# Patient Record
Sex: Female | Born: 1995 | Race: White | Hispanic: No | Marital: Married | Smoking: Never smoker
Health system: Southern US, Community
[De-identification: ages and names within clinical notes are randomized; demographics above are authoritative.]

## PROBLEM LIST (undated history)

## (undated) DIAGNOSIS — Z789 Other specified health status: Secondary | ICD-10-CM

## (undated) HISTORY — PX: TONSILLECTOMY: SUR1361

## (undated) HISTORY — DX: Other specified health status: Z78.9

---
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
## 2013-01-14 ENCOUNTER — Ambulatory Visit: Payer: 59

## 2013-08-10 ENCOUNTER — Encounter: Payer: Self-pay | Admitting: *Deleted

## 2013-11-30 ENCOUNTER — Observation Stay: Payer: Self-pay

## 2014-01-08 ENCOUNTER — Inpatient Hospital Stay: Payer: Self-pay | Admitting: Obstetrics and Gynecology

## 2014-01-08 LAB — CBC WITH DIFFERENTIAL/PLATELET
BASOS ABS: 0 10*3/uL (ref 0.0–0.1)
Basophil %: 0.1 %
EOS PCT: 0.3 %
Eosinophil #: 0 10*3/uL (ref 0.0–0.7)
HCT: 40.5 % (ref 35.0–47.0)
HGB: 13.1 g/dL (ref 12.0–16.0)
LYMPHS ABS: 1.4 10*3/uL (ref 1.0–3.6)
Lymphocyte %: 10.6 %
MCH: 30.3 pg (ref 26.0–34.0)
MCHC: 32.3 g/dL (ref 32.0–36.0)
MCV: 94 fL (ref 80–100)
MONO ABS: 0.7 x10 3/mm (ref 0.2–0.9)
Monocyte %: 5.2 %
NEUTROS ABS: 10.8 10*3/uL — AB (ref 1.4–6.5)
NEUTROS PCT: 83.8 %
Platelet: 201 10*3/uL (ref 150–440)
RBC: 4.33 10*6/uL (ref 3.80–5.20)
RDW: 12.5 % (ref 11.5–14.5)
WBC: 12.9 10*3/uL — AB (ref 3.6–11.0)

## 2014-01-08 LAB — GC/CHLAMYDIA PROBE AMP

## 2014-01-10 LAB — HEMATOCRIT: HCT: 33.1 % — ABNORMAL LOW (ref 35.0–47.0)

## 2014-10-05 NOTE — H&P (Signed)
L&D Evaluation:  History:  HPI 19 year old g1 P0 with EDC 01/03/2014 by a 6wk4day ultrasound presents at 35 1/7 weeks with decreased FM this AM. [facility] uncomplicated. Denies VB or LOF.   Presents with decreased fetal movement   Patient's Medical History No Chronic Illness   Patient's Surgical History none   Medications Pre Natal Vitamins   Allergies NKDA   Social History none   Family History Non-Contributory   ROS:  ROS see HPI   Exam:  Vital Signs stable  106/67   Urine Protein not completed   General no apparent distress   Mental Status clear   Abdomen gravid, non-tender   Mebranes Intact   FHT normal rate with no decels, 130 baseline with accels to 150-180s   Ucx occ, mild   Skin dry   Impression:  Impression IUP at 35 1/7 weeks with reactive NST   Plan:  [facility]  [facility]   Electronic Signatures: Trinna BalloonGutierrez, Zyann Mabry L (CNM)  (Signed 06-Jul-15 17:16)  Authored: L&D Evaluation   Last Updated: 06-Jul-15 17:16 by Trinna BalloonGutierrez, Liliya Fullenwider L (CNM)

## 2014-10-14 ENCOUNTER — Encounter: Payer: Self-pay | Admitting: Family Medicine

## 2014-10-14 ENCOUNTER — Ambulatory Visit (INDEPENDENT_AMBULATORY_CARE_PROVIDER_SITE_OTHER): Payer: 59 | Admitting: Family Medicine

## 2014-10-14 VITALS — BP 104/80 | Temp 97.9°F | Ht 65.0 in | Wt 136.5 lb

## 2014-10-14 DIAGNOSIS — B9689 Other specified bacterial agents as the cause of diseases classified elsewhere: Secondary | ICD-10-CM

## 2014-10-14 DIAGNOSIS — J019 Acute sinusitis, unspecified: Secondary | ICD-10-CM | POA: Diagnosis not present

## 2014-10-14 MED ORDER — AMOXICILLIN 500 MG PO TABS: 500.0000 mg | ORAL_TABLET | Freq: Three times a day (TID) | ORAL | Status: DC

## 2014-10-14 NOTE — Progress Notes (Signed)
   Subjective:    Patient ID: Lynn Rice, female    DOB: 10-Dec-1995, 19 y.o.   MRN: [id number]  Sinusitis This is a new problem. The current episode started 1 to 4 weeks ago. The problem is unchanged. There has been no fever. Associated symptoms include congestion, coughing, ear pain and sinus pressure. Pertinent negatives include no shortness of breath. Treatments tried: OTC Cough/Sinus cold medicine. The treatment provided no relief.   Patient has no other concerns this visit.   Review of Systems  Constitutional: Negative for fever and activity change.  HENT: Positive for congestion, ear pain, rhinorrhea and sinus pressure.   Eyes: Negative for discharge.  Respiratory: Positive for cough. Negative for shortness of breath and wheezing.   Cardiovascular: Negative for chest pain.       Objective:   Physical Exam  Constitutional: She appears well-developed.  HENT:  Head: Normocephalic.  Nose: Nose normal.  Mouth/Throat: Oropharynx is clear and moist. No oropharyngeal exudate.  Neck: Neck supple.  Cardiovascular: Normal rate and normal heart sounds.   No murmur heard. Pulmonary/Chest: Effort normal and breath sounds normal. She has no wheezes.  Lymphadenopathy:    She has no cervical adenopathy.  Skin: Skin is warm and dry.  Nursing note and vitals reviewed.         Assessment & Plan:  Viral illness probably picked up from her child Secondary sinusitis Warning signs discussed Follow-up if ongoing troubles

## 2015-09-01 DIAGNOSIS — Z3040 Encounter for surveillance of contraceptives, unspecified: Secondary | ICD-10-CM | POA: Diagnosis not present

## 2015-09-01 DIAGNOSIS — Z01419 Encounter for gynecological examination (general) (routine) without abnormal findings: Secondary | ICD-10-CM | POA: Diagnosis not present

## 2015-09-01 DIAGNOSIS — Z124 Encounter for screening for malignant neoplasm of cervix: Secondary | ICD-10-CM | POA: Diagnosis not present

## 2016-08-14 ENCOUNTER — Telehealth: Payer: Self-pay | Admitting: Obstetrics and Gynecology

## 2016-08-14 NOTE — Telephone Encounter (Signed)
Pt contacted office for refill request on the following medications: Junel FE 1/20 1 mg-320mcg. [facility]. Pt is schedule 09/10/16 with [facility]. CB#[phone]

## 2016-08-14 NOTE — Telephone Encounter (Signed)
Please refill.

## 2016-08-15 MED ORDER — NORETHINDRONE ACET-ETHINYL EST 1-20 MG-MCG PO TABS: 1.0000 | ORAL_TABLET | Freq: Every day | ORAL | 2 refills | Status: DC

## 2016-08-15 NOTE — Telephone Encounter (Signed)
Has been sent.

## 2016-08-17 ENCOUNTER — Telehealth: Payer: Self-pay

## 2016-08-17 NOTE — Telephone Encounter (Signed)
Pt calling today spoke with HopewellSandy S. About the Birth Control refill that was sent in. States it was the wrong kind and would like a call back .

## 2016-08-17 NOTE — Telephone Encounter (Signed)
Questions answered regarding the birthcontrol that was called in. KJ CMA

## 2016-09-10 ENCOUNTER — Encounter: Payer: Self-pay | Admitting: Obstetrics and Gynecology

## 2016-09-11 ENCOUNTER — Ambulatory Visit (INDEPENDENT_AMBULATORY_CARE_PROVIDER_SITE_OTHER): Payer: 59 | Admitting: Obstetrics and Gynecology

## 2016-09-11 ENCOUNTER — Encounter: Payer: Self-pay | Admitting: Obstetrics and Gynecology

## 2016-09-11 DIAGNOSIS — Z124 Encounter for screening for malignant neoplasm of cervix: Secondary | ICD-10-CM

## 2016-09-11 DIAGNOSIS — Z01419 Encounter for gynecological examination (general) (routine) without abnormal findings: Secondary | ICD-10-CM

## 2016-09-11 DIAGNOSIS — Z113 Encounter for screening for infections with a predominantly sexual mode of transmission: Secondary | ICD-10-CM

## 2016-09-11 MED ORDER — NORETHINDRONE ACET-ETHINYL EST 1-20 MG-MCG PO TABS: 1.0000 | ORAL_TABLET | Freq: Every day | ORAL | 3 refills | Status: DC

## 2016-09-11 NOTE — Patient Instructions (Signed)
Preventive Care 18-39 Years, Female Preventive care refers to lifestyle choices and visits with your health care provider that can promote health and wellness. What does preventive care include?  A yearly physical exam. This is also called an annual well check.  Dental exams once or twice a year.  Routine eye exams. Ask your health care provider how often you should have your eyes checked.  Personal lifestyle choices, including:  Daily care of your teeth and gums.  Regular physical activity.  Eating a healthy diet.  Avoiding tobacco and drug use.  Limiting alcohol use.  Practicing safe sex.  Taking vitamin and mineral supplements as recommended by your health care provider. What happens during an annual well check? The services and screenings done by your health care provider during your annual well check will depend on your age, overall health, lifestyle risk factors, and family history of disease. Counseling  Your health care provider may ask you questions about your:  Alcohol use.  Tobacco use.  Drug use.  Emotional well-being.  Home and relationship well-being.  Sexual activity.  Eating habits.  Work and work environment.  Method of birth control.  Menstrual cycle.  Pregnancy history. Screening  You may have the following tests or measurements:  Height, weight, and BMI.  Diabetes screening. This is done by checking your blood sugar (glucose) after you have not eaten for a while (fasting).  Blood pressure.  Lipid and cholesterol levels. These may be checked every 5 years starting at age 20.  Skin check.  Hepatitis C blood test.  Hepatitis B blood test.  Sexually transmitted disease (STD) testing.  BRCA-related cancer screening. This may be done if you have a family history of breast, ovarian, tubal, or peritoneal cancers.  Pelvic exam and Pap test. This may be done every 3 years starting at age 21. Starting at age 30, this may be done every 5  years if you have a Pap test in combination with an HPV test. Discuss your test results, treatment options, and if necessary, the need for more tests with your health care provider. Vaccines  Your health care provider may recommend certain vaccines, such as:  Influenza vaccine. This is recommended every year.  Tetanus, diphtheria, and acellular pertussis (Tdap, Td) vaccine. You may need a Td booster every 10 years.  Varicella vaccine. You may need this if you have not been vaccinated.  HPV vaccine. If you are 26 or younger, you may need three doses over 6 months.  Measles, mumps, and rubella (MMR) vaccine. You may need at least one dose of MMR. You may also need a second dose.  Pneumococcal 13-valent conjugate (PCV13) vaccine. You may need this if you have certain conditions and were not previously vaccinated.  Pneumococcal polysaccharide (PPSV23) vaccine. You may need one or two doses if you smoke cigarettes or if you have certain conditions.  Meningococcal vaccine. One dose is recommended if you are age 19-21 years and a first-year college student living in a residence hall, or if you have one of several medical conditions. You may also need additional booster doses.  Hepatitis A vaccine. You may need this if you have certain conditions or if you travel or work in places where you may be exposed to hepatitis A.  Hepatitis B vaccine. You may need this if you have certain conditions or if you travel or work in places where you may be exposed to hepatitis B.  Haemophilus influenzae type b (Hib) vaccine. You may need this   if you have certain risk factors. Talk to your health care provider about which screenings and vaccines you need and how often you need them. This information is not intended to replace advice given to you by your health care provider. Make sure you discuss any questions you have with your health care provider. Document Released: 07/10/2001 Document Revised: 02/01/2016  Document Reviewed: 03/15/2015 Elsevier Interactive Patient Education  2017 [employer].

## 2016-09-11 NOTE — Progress Notes (Addendum)
Patient ID: Lynn Rice, female   DOB: [birth year], 21 y.o.   MRN: [id number]     Gynecology Annual Exam  PCP: Lilyan Punt, MD  Chief Complaint:  Chief Complaint  Patient presents with  . Gynecologic Exam    History of Present Illness: Patient is a 21 y.o. G1P1001 presents for annual exam. The patient has no complaints today.   LMP: Patient's last menstrual period was 09/03/2016 (exact date). Average Interval: regular, 28 days Duration of flow: 5 days Heavy Menses: no Clots: no Intermenstrual Bleeding: no Postcoital Bleeding: no Dysmenorrhea: no  The patient is sexually active. She currently uses Oral contraceptives: Present: yes for contraception. She denies dyspareunia.  The patient does not perform self breast exams.  There is no notable family history of breast or ovarian cancer in her family.  The patient wears seatbelts: yes.  The patient has regular exercise: not asked.    The patient denies current symptoms of depression.    Review of Systems: Review of Systems  Constitutional: Negative for chills and fever.  HENT: Negative for congestion.   Respiratory: Negative for cough and shortness of breath.   Cardiovascular: Negative for chest pain and palpitations.  Gastrointestinal: Negative for abdominal pain, constipation, diarrhea, heartburn, nausea and vomiting.  Genitourinary: Negative for dysuria, frequency and urgency.  Skin: Negative for itching and rash.  Neurological: Negative for dizziness and headaches.  Endo/Heme/Allergies: Negative for polydipsia.  Psychiatric/Behavioral: Negative for depression.    Past Medical History:  No past medical history on file.  Past Surgical History:  Past Surgical History:  Procedure Laterality Date  . TONSILLECTOMY      Gynecologic History:  Patient's last menstrual period was 09/03/2016 (exact date). Contraception: OCP (estrogen/progesterone) Last Pap: Results were: no abnormalities   Obstetric History:  G1P1001  Family History:  Family History  Problem Relation Age of Onset  . Kidney disease Paternal Grandfather   . Heart failure Paternal Grandfather   . Cervical cancer Maternal Grandmother     Social History:  Social History   Social History  . Marital status: Single    Spouse name: N/A  . Number of children: N/A  . Years of education: N/A   Occupational History  . Not on file.   Social History Main Topics  . Smoking status: Never Smoker  . Smokeless tobacco: Never Used  . Alcohol use Yes  . Drug use: No  . Sexual activity: Yes    Partners: Male    Birth control/ protection: Pill   Other Topics Concern  . Not on file   Social History Narrative  . No narrative on file    Allergies:  No Known Allergies  Medications: Prior to Admission medications   Medication Sig Start Date End Date Taking? Authorizing Provider  norethindrone-ethinyl estradiol Elmarie Shiley 1/20) 1-20 MG-MCG tablet Take 1 tablet by mouth daily. 08/15/16   Vena Austria, MD    Physical Exam Vitals: Blood pressure 102/70, height  (1.651 m), weight 155 lb (70.3 kg), last menstrual period 09/03/2016.  General: NAD HEENT: normocephalic, anicteric Thyroid: no enlargement, no palpable nodules Pulmonary: No increased work of breathing, CTAB Cardiovascular: RRR, distal pulses 2+ Breast: Breast symmetrical, no tenderness, no palpable nodules or masses, no skin or nipple retraction present, no nipple discharge.  No axillary or supraclavicular lymphadenopathy. Abdomen: NABS, soft, non-tender, non-distended.  Umbilicus without lesions.  No hepatomegaly, splenomegaly or masses palpable. No evidence of hernia  Genitourinary:  External: Normal external female genitalia.  Normal urethral meatus,  normal  Bartholin's and Skene's glands.    Vagina: Normal vaginal mucosa, no evidence of prolapse.    Cervix: Grossly normal in appearance, no bleeding  Uterus: Non-enlarged, mobile, normal contour.  No  CMT  Adnexa: ovaries non-enlarged, no adnexal masses  Rectal: deferred  Lymphatic: no evidence of inguinal lymphadenopathy Extremities: no edema, erythema, or tenderness Neurologic: Grossly intact Psychiatric: mood appropriate, affect full  Female chaperone present for pelvic and breast  portions of the physical exam    Assessment: 21 y.o. G1P1001 routine annual exam  Plan: Problem List Items Addressed This Visit    None    Visit Diagnoses    Screening for malignant neoplasm of cervix       Relevant Orders   Pap Lb, Ct-Ng, rfx HPV ASCU   Encounter for gynecological examination without abnormal finding       Relevant Orders   Pap Lb, Ct-Ng, rfx HPV ASCU   Screen for STD (sexually transmitted disease)       Relevant Orders   Pap Lb, Ct-Ng, rfx HPV ASCU      1) 4) Gardasil Series discussed and if applicable offered to patient - Patient has previously completed 3 shot series   2) STI screening was offered and declined - GC/CT with pap as per USPTF and CDC recommendations  3) ASCCP guidelines and rational discussed.  Patient opts for every 3 years screening interval  4) Contraception - Education given regarding options for contraception, continue current OCP. We discussed safe sex practices to reduce her furture risk of STI's.    5) Follow up 1 year for routine annual exam

## 2016-09-14 LAB — PAP LB, CT-NG, RFX HPV ASCU
Chlamydia, Nuc. Acid Amp: NEGATIVE
GONOCOCCUS, NUC. ACID AMP: NEGATIVE
PAP Smear Comment: 0

## 2016-09-17 ENCOUNTER — Encounter: Payer: Self-pay | Admitting: Obstetrics and Gynecology

## 2016-12-19 ENCOUNTER — Ambulatory Visit: Payer: 59 | Admitting: Advanced Practice Midwife

## 2016-12-24 ENCOUNTER — Ambulatory Visit: Payer: 59 | Admitting: Obstetrics and Gynecology

## 2016-12-24 ENCOUNTER — Ambulatory Visit (INDEPENDENT_AMBULATORY_CARE_PROVIDER_SITE_OTHER): Payer: 59 | Admitting: Obstetrics and Gynecology

## 2016-12-24 ENCOUNTER — Encounter: Payer: Self-pay | Admitting: Obstetrics and Gynecology

## 2016-12-24 VITALS — BP 104/54 | HR 82 | Wt 150.0 lb

## 2016-12-24 DIAGNOSIS — Z3A08 8 weeks gestation of pregnancy: Secondary | ICD-10-CM

## 2016-12-24 DIAGNOSIS — N912 Amenorrhea, unspecified: Secondary | ICD-10-CM | POA: Diagnosis not present

## 2016-12-24 DIAGNOSIS — Z3490 Encounter for supervision of normal pregnancy, unspecified, unspecified trimester: Secondary | ICD-10-CM

## 2016-12-24 DIAGNOSIS — Z3480 Encounter for supervision of other normal pregnancy, unspecified trimester: Secondary | ICD-10-CM

## 2016-12-24 DIAGNOSIS — Z3689 Encounter for other specified antenatal screening: Secondary | ICD-10-CM

## 2016-12-24 LAB — POCT URINE PREGNANCY: Preg Test, Ur: POSITIVE — AB

## 2016-12-24 NOTE — Progress Notes (Signed)
Patient ID: Lynn Rice, female   DOB: 27-Jul-1995, 21 y.o.   MRN: [id number]       New Obstetric Patient H&P    Chief Complaint: "Desires prenatal care"   History of Present Illness: Patient is a 21 y.o. G2P1001 Not Hispanic or Latino female, Patient's last menstrual period was 10/29/2016. presents with amenorrhea and positive home pregnancy test. Based on her  LMP, her EDD is Estimated Date of Delivery: 08/05/17 and her EGA is [id number].  She had a urine pregnancy test which was positive 3 week(s)  ago. Her last menstrual period was normal and lasted for  4 day(s). Since her LMP she claims she has experienced some mild nausea, breast tenderness, and fatigue. She denies vaginal bleeding. Her past medical history is noncontributory. Her prior pregnancies are notable for none  Since her LMP, she admits to the use of tobacco products  no She claims she has gained   no pounds since the start of her pregnancy.  There are cats in the home in the home  no If yes N/A She admits close contact with children on a regular basis  yes  She has had chicken pox in the past unknown She has had Tuberculosis exposures, symptoms, or previously tested positive for TB   no Current or past history of domestic violence. no  Genetic Screening/Teratology Counseling: (Includes patient, baby's father, or anyone in either family with:)   1. [facility]/= [facility]  no 2. Thalassemia ([place], [place], Mediterranean, or Asian background): MCV<80  no 3. Neural tube defect (meningomyelocele, spina bifida, anencephaly)  no 4. Congenital heart defect  no  5. Down syndrome  no 6. Tay-Sachs (Jewish, [place])  no 7. Canavan's Disease  no 8. Sickle cell disease or trait (African)  no  9. Hemophilia or other blood disorders  no  10. Muscular dystrophy  no  11. Cystic fibrosis  no  12. Huntington's Chorea  no  13. Mental retardation/autism  no 14. Other inherited genetic or chromosomal disorder  no 15.  Maternal metabolic disorder (DM, PKU, etc)  no 16. Patient or FOB with a child with a birth defect not listed above no  16a. Patient or FOB with a birth defect themselves no 17. Recurrent pregnancy loss, or stillbirth  no  18. Any medications since LMP other than prenatal vitamins (include vitamins, supplements, OTC meds, drugs, alcohol)  no 19. Any other genetic/environmental exposure to discuss  no  Infection History:   1. Lives with someone with TB or TB exposed  no  2. Patient or partner has history of genital herpes  no 3. Rash or viral illness since LMP  no 4. History of STI (GC, CT, HPV, syphilis, HIV)  no 5. History of recent travel :  no  Other pertinent information:  no     Review of Systems:10 point review of systems negative unless otherwise noted in HPI  Past Medical History:  No past medical history on file.  Past Surgical History:  Past Surgical History:  Procedure Laterality Date  . TONSILLECTOMY      Gynecologic History: Patient's last menstrual period was 10/29/2016.  Obstetric History: G2P1001  Family History:  Family History  Problem Relation Age of Onset  . Kidney disease Paternal Grandfather   . Heart failure Paternal Grandfather   . Cervical cancer Maternal Grandmother     Social History:  Social History   Social History  . Marital status: Single    Spouse name:  N/A  . Number of children: N/A  . Years of education: N/A   Occupational History  . Not on file.   Social History Main Topics  . Smoking status: Never Smoker  . Smokeless tobacco: Never Used  . Alcohol use Yes  . Drug use: No  . Sexual activity: Yes    Partners: Male    Birth control/ protection: Pill   Other Topics Concern  . Not on file   Social History Narrative  . No narrative on file    Allergies:  No Known Allergies  Medications: Prior to Admission medications   Not on File    Physical Exam Vitals: Blood pressure (!) 104/54, pulse 82, weight 150 lb (68  kg), last menstrual period 10/29/2016.  General: NAD HEENT: normocephalic, anicteric Pulmonary: No increased work of breathing, CTAB Cardiovascular: RRR, distal pulses 2+ Abdomen: NABS, soft, non-tender, non-distended.  Umbilicus without lesions.  No hepatomegaly, splenomegaly or masses palpable. No evidence of hernia  Genitourinary:  External: Normal external female genitalia.  Normal urethral meatus, normal  Bartholin's and Skene's glands.    Vagina: Normal vaginal mucosa, no evidence of prolapse.    Cervix: Grossly normal in appearance, no bleeding  Uterus: Non-enlarged, mobile, normal contour.  No CMT  Adnexa: ovaries non-enlarged, no adnexal masses  Rectal: deferred Extremities: no edema, erythema, or tenderness Neurologic: Grossly intact Psychiatric: mood appropriate, affect full   Assessment: 21 y.o. G2P1001 at [redacted] weeks gestation presenting to initiate prenatal care  Plan: 1) Avoid alcoholic beverages. 2) Patient encouraged not to smoke.  3) Discontinue the use of all non-medicinal drugs and chemicals.  4) Take prenatal vitamins daily.  5) Nutrition, food safety (fish, cheese advisories, and high nitrite foods) and exercise discussed. 6) Hospital and practice style discussed with cross coverage system.  7) Genetic Screening, such as with 1st Trimester Screening, cell free fetal DNA, AFP testing, and Ultrasound, as well as with amniocentesis and CVS as appropriate, is discussed with patient. At the conclusion of today's visit patient undecided genetic testing 8) Patient is asked about travel to areas at risk for the [place] virus, and counseled to avoid travel and exposure to mosquitoes or sexual partners who may have themselves been exposed to the virus. Testing is discussed, and will be ordered as appropriate.

## 2016-12-24 NOTE — Progress Notes (Signed)
NOB 

## 2016-12-24 NOTE — Patient Instructions (Addendum)
First Trimester of Pregnancy The first trimester of pregnancy is from week 1 until the end of week 13 (months 1 through 3). A week after a sperm fertilizes an egg, the egg will implant on the wall of the uterus. This embryo will begin to develop into a baby. Genes from you and your partner will form the baby. The female genes will determine whether the baby will be a boy or a girl. At 6-8 weeks, the eyes and face will be formed, and the heartbeat can be seen on ultrasound. At the end of 12 weeks, all the baby's organs will be formed. Now that you are pregnant, you will want to do everything you can to have a healthy baby. Two of the most important things are to get good prenatal care and to follow your health care provider's instructions. Prenatal care is all the medical care you receive before the baby's birth. This care will help prevent, find, and treat any problems during the pregnancy and childbirth. Body changes during your first trimester Your body goes through many changes during pregnancy. The changes vary from woman to woman.  You may gain or lose a couple of pounds at first.  You may feel sick to your stomach (nauseous) and you may throw up (vomit). If the vomiting is uncontrollable, call your health care provider.  You may tire easily.  You may develop headaches that can be relieved by medicines. All medicines should be approved by your health care provider.  You may urinate more often. Painful urination may mean you have a bladder infection.  You may develop heartburn as a result of your pregnancy.  You may develop constipation because certain hormones are causing the muscles that push stool through your intestines to slow down.  You may develop hemorrhoids or swollen veins (varicose veins).  Your breasts may begin to grow larger and become tender. Your nipples may stick out more, and the tissue that surrounds them (areola) may become darker.  Your gums may bleed and may be  sensitive to brushing and flossing.  Dark spots or blotches (chloasma, mask of pregnancy) may develop on your face. This will likely fade after the baby is born.  Your menstrual periods will stop.  You may have a loss of appetite.  You may develop cravings for certain kinds of food.  You may have changes in your emotions from day to day, such as being excited to be pregnant or being concerned that something may go wrong with the pregnancy and baby.  You may have more vivid and strange dreams.  You may have changes in your hair. These can include thickening of your hair, rapid growth, and changes in texture. Some women also have hair loss during or after pregnancy, or hair that feels dry or thin. Your hair will most likely return to normal after your baby is born.  What to expect at prenatal visits During a routine prenatal visit:  You will be weighed to make sure you and the baby are growing normally.  Your blood pressure will be taken.  Your abdomen will be measured to track your baby's growth.  The fetal heartbeat will be listened to between weeks 10 and 14 of your pregnancy.  Test results from any previous visits will be discussed.  Your health care provider may ask you:  How you are feeling.  If you are feeling the baby move.  If you have had any abnormal symptoms, such as leaking fluid, bleeding, severe headaches,   or abdominal cramping.  If you are using any tobacco products, including cigarettes, chewing tobacco, and electronic cigarettes.  If you have any questions.  Other tests that may be performed during your first trimester include:  Blood tests to find your blood type and to check for the presence of any previous infections. The tests will also be used to check for low iron levels (anemia) and protein on red blood cells (Rh antibodies). Depending on your risk factors, or if you previously had diabetes during pregnancy, you may have tests to check for high blood  sugar that affects pregnant women (gestational diabetes).  Urine tests to check for infections, diabetes, or protein in the urine.  An ultrasound to confirm the proper growth and development of the baby.  Fetal screens for spinal cord problems (spina bifida) and Down syndrome.  HIV (human immunodeficiency virus) testing. Routine prenatal testing includes screening for HIV, unless you choose not to have this test.  You may need other tests to make sure you and the baby are doing well.  Follow these instructions at home: Medicines  Follow your health care provider's instructions regarding medicine use. Specific medicines may be either safe or unsafe to take during pregnancy.  Take a prenatal vitamin that contains at least 600 micrograms (mcg) of folic acid.  If you develop constipation, try taking a stool softener if your health care provider approves. Eating and drinking  Eat a balanced diet that includes fresh fruits and vegetables, whole grains, good sources of protein such as meat, eggs, or tofu, and low-fat dairy. Your health care provider will help you determine the amount of weight gain that is right for you.  Avoid raw meat and uncooked cheese. These carry germs that can cause birth defects in the baby.  Eating four or five small meals rather than three large meals a day may help relieve nausea and vomiting. If you start to feel nauseous, eating a few soda crackers can be helpful. Drinking liquids between meals, instead of during meals, also seems to help ease nausea and vomiting.  Limit foods that are high in fat and processed sugars, such as fried and sweet foods.  To prevent constipation: ? Eat foods that are high in fiber, such as fresh fruits and vegetables, whole grains, and beans. ? Drink enough fluid to keep your urine clear or pale yellow. Activity  Exercise only as directed by your health care provider. Most women can continue their usual exercise routine during  pregnancy. Try to exercise for 30 minutes at least 5 days a week. Exercising will help you: ? Control your weight. ? Stay in shape. ? Be prepared for labor and delivery.  Experiencing pain or cramping in the lower abdomen or lower back is a good sign that you should stop exercising. Check with your health care provider before continuing with normal exercises.  Try to avoid standing for long periods of time. Move your legs often if you must stand in one place for a long time.  Avoid heavy lifting.  Wear low-heeled shoes and practice good posture.  You may continue to have sex unless your health care provider tells you not to. Relieving pain and discomfort  Wear a good support bra to relieve breast tenderness.  Take warm sitz baths to soothe any pain or discomfort caused by hemorrhoids. Use hemorrhoid cream if your health care provider approves.  Rest with your legs elevated if you have leg cramps or low back pain.  If you develop   varicose veins in your legs, wear support hose. Elevate your feet for 15 minutes, 3-4 times a day. Limit salt in your diet. Prenatal care  Schedule your prenatal visits by the twelfth week of pregnancy. They are usually scheduled monthly at first, then more often in the last 2 months before delivery.  Write down your questions. Take them to your prenatal visits.  Keep all your prenatal visits as told by your health care provider. This is important. Safety  Wear your seat belt at all times when driving.  Make a list of emergency phone numbers, including numbers for family, friends, the hospital, and police and fire departments. General instructions  Ask your health care provider for a referral to a local prenatal education class. Begin classes no later than the beginning of month 6 of your pregnancy.  Ask for help if you have counseling or nutritional needs during pregnancy. Your health care provider can offer advice or refer you to specialists for help  with various needs.  Do not use hot tubs, steam rooms, or saunas.  Do not douche or use tampons or scented sanitary pads.  Do not cross your legs for long periods of time.  Avoid cat litter boxes and soil used by cats. These carry germs that can cause birth defects in the baby and possibly loss of the fetus by miscarriage or stillbirth.  Avoid all smoking, herbs, alcohol, and medicines not prescribed by your health care provider. Chemicals in these products affect the formation and growth of the baby.  Do not use any products that contain nicotine or tobacco, such as cigarettes and e-cigarettes. If you need help quitting, ask your health care provider. You may receive counseling support and other resources to help you quit.  Schedule a dentist appointment. At home, brush your teeth with a soft toothbrush and be gentle when you floss. Contact a health care provider if:  You have dizziness.  You have mild pelvic cramps, pelvic pressure, or nagging pain in the abdominal area.  You have persistent nausea, vomiting, or diarrhea.  You have a bad smelling vaginal discharge.  You have pain when you urinate.  You notice increased swelling in your face, hands, legs, or ankles.  You are exposed to fifth disease or chickenpox.  You are exposed to German measles (rubella) and have never had it. Get help right away if:  You have a fever.  You are leaking fluid from your vagina.  You have spotting or bleeding from your vagina.  You have severe abdominal cramping or pain.  You have rapid weight gain or loss.  You vomit blood or material that looks like coffee grounds.  You develop a severe headache.  You have shortness of breath.  You have any kind of trauma, such as from a fall or a car accident. Summary  The first trimester of pregnancy is from week 1 until the end of week 13 (months 1 through 3).  Your body goes through many changes during pregnancy. The changes vary from  woman to woman.  You will have routine prenatal visits. During those visits, your health care provider will examine you, discuss any test results you may have, and talk with you about how you are feeling. This information is not intended to replace advice given to you by your health care provider. Make sure you discuss any questions you have with your health care provider. Document Released: 05/08/2001 Document Revised: 04/25/2016 Document Reviewed: 04/25/2016 Elsevier Interactive Patient Education  2017 [employer]   Inc.  This is a very exciting time for you and your family, so congratulations from [facility]! You have just embarked on a very amazing journey. The next several months will be filled with wondrous emotions and miraculous memories. As you begin your preparations, this office wanted you to be aware of a few prenatal genetic laboratory tests that are available to you early in your pregnancy. These tests are optional and you may decide to opt out of the testing.   Patients often voice their desire to opt out of this testing as it would not change their decisions on continuing the pregnancy.  However, our providers value the information they receive from these tests as they allow us to optimize the care for you and your baby prior to delivery.    There are 6 genetic laboratory tests this office offers, and they test for a variety of different genetic diseases or chromosomal abnormalities. By utilizing these tests, the providers can better understand your risk associated with passing on a genetic condition to your child. These tests are screening tests, and are not used to diagnose any condition. If one of these tests results abnormal, then a diagnostic test will be offered to you. It is important to remember that most pregnancies will result in a beautiful and healthy baby. Knowing the results of these tests will also help you better prepare for your delivery.  We encourage you to read  over the brief descriptions below and engage with your provider regarding any additional questions you may have.  Please also make your provider aware if you or your partner has any Jewish ancestry as there may be additional testing that you qualify for. The tests that will be ordered are: 1. Cystic Fibrosis Carrier Screen1 (Blood Test): The most common autosomal recessive disorder. This disease causes thick mucus to build up in the lungs, which leads to repetitive chest infections. The carrier frequency in caucasians is roughly 1 in 30 people in the [place], but varies by ethnicity.   2. Spinal Muscular Atrophy Carrier Screen1 (Blood Test):  The second most common autosomal recessive disorder and the most common inherited form of early childhood death. This is degenerative neuromuscular condition that affects the child's ability to sit, smile, breath, swallow, etc. The carrier frequency is the [place] is roughly 1 in 7354, but varies by ethnicity. 3. Fragile X Syndrome Carrier Screen1 (Blood Test): The most common form of inherited mental retardation. The severity of the retardation varies. Fragile X is most commonly passed from mother to child, and 1 in 6[address]60 people in the [place] are carriers. 4. Downs Syndrome, Trisomy 18: Trisomy 5221 ( Downs Syndrome) and Trisomy 18 are common forms of mental retardation due to chromosomal abnormalities. Jeral PinchDowns is the milder of the two, and it occurs 1 in 700 live births. Trisomy 6318 is a more severe form of mental retardation, and occurs in 1 in 6000 live births. The risk of conceiving a baby with one of these two genetic conditions is independent of family history.  The test offered to you will depend on how far along you are in your pregnancy.  The main risk factor that has been identified as predisposing to either trisomy 6221 or trisomy 3318 is the age of the mother, with increased risk for those patients over the age of 23 at the time of delivery.  If you are  deemed high risk on the initial screening test or are over 35 you  may be a candidate for cell free fetal DNA testing or amniocentesis outlined below.  See #6, 7, 8, and 9 for available testing options 5.  MSAFP2 Maternal Serum Alpha Feto-Protein (Blood Test):  Open Neural Tube Defects deal with an opening in the spinal column that does not close properly during pregnancy. It occurs when the tissues that fold to form the neural tube do not close or do not stay closed completely. This causes an opening in the vertebrae, which surround and protect the spinal cord. The most common form is Spina Bifida. It occurs in 1 in 1000 live births. The folic acid in prenatal vitamins can decrease the risk of you baby developing this birth defect.  If you have had a prior pregnancy complicated by spina bifida you may need higher doses of folic acid.   6. 1st trimester screening2 (Blood Test):  this test is a combination of an ultrasound measurement of your baby's neck and blood work ideally obtained between 11 weeks and 13 weeks 6 days gestation.  It tests for #4 above 7. Tetra Screen2 (Blood Test):  this is a combined blood test offered at 15 to [redacted] weeks gestation.  It tests for conditions #4 and #5 above 8. Cell Free Fetal DNA2 (Blood Test):  this test is available for our patient over 35 or patient who were found to be at increased risk for down syndrome or trisomy 18 based on either 1st trimester screening results or serum tetra screening.  At present this testing is still considered screening rather than diagnostic 9. Amniocentesis - this test is available for our patient over 35 or patient who were found to be at increased risk for down syndrome or trisomy 18 based on either 1st trimester screening results or serum tetra screening.  This test involves using a needle to draw off some of the fluid from around the baby in order to determine if the fetus is affected by either trisomy 21 (Downs Syndrome) or trisomy 2418.  In  addition this test may be suggested or offered by your provider in other circumstances.  This test is considered diagnostic as opposed to screening meaning that it can definitively rule in or rule out the tested condition.  Unlike the other testing discussed amniocentesis is an invasive procedure and is associated with a small risk (approximately 1 in 200) of resulting in a miscarriage. 1. It is also important to notate that if you screen positive for carrier status for Cystic Fibrosis or Spinal Muscular Atrophy, that does not mean your child will be affected with the condition. The child's father will also need to be tested. If both of you are carriers, then there is a 25% that you will have an affected child. The risk of having a child affected with Downs Syndrome or Trisomy 3018 varies by maternal age. There is not a "carrier" status for these conditions. If you would like more information of these conditions, please see the handouts in your packet of information. 2. Denotes screening tests.   These tests can assess the risk of the pregnancy being affected by a particular condition.  It is important to note that even if testing deems that you are at increased risk your baby may still be unaffected.  Conversely, test results indicating that your baby is at low risk for the tested condition does not rule out that the baby could still be affected by that condition

## 2016-12-25 LAB — RPR+RH+ABO+RUB AB+AB SCR+CB...
Antibody Screen: NEGATIVE
HEMATOCRIT: 37.8 % (ref 34.0–46.6)
HEMOGLOBIN: 13 g/dL (ref 11.1–15.9)
HEP B S AG: NEGATIVE
HIV SCREEN 4TH GENERATION: NONREACTIVE
MCH: 30.6 pg (ref 26.6–33.0)
MCHC: 34.4 g/dL (ref 31.5–35.7)
MCV: 89 fL (ref 79–97)
Platelets: 280 10*3/uL (ref 150–379)
RBC: 4.25 x10E6/uL (ref 3.77–5.28)
RDW: 13.3 % (ref 12.3–15.4)
RPR Ser Ql: NONREACTIVE
Rh Factor: POSITIVE
Rubella Antibodies, IGG: <0.9 index — ABNORMAL LOW (ref >0.99)
VARICELLA: 314 {index} (ref >165)
WBC: 10.1 10*3/uL (ref 3.4–10.8)

## 2016-12-26 ENCOUNTER — Encounter: Payer: Self-pay | Admitting: Obstetrics and Gynecology

## 2016-12-26 DIAGNOSIS — Z3480 Encounter for supervision of other normal pregnancy, unspecified trimester: Secondary | ICD-10-CM | POA: Insufficient documentation

## 2016-12-26 LAB — URINE CULTURE: ORGANISM ID, BACTERIA: NO GROWTH

## 2016-12-26 LAB — PAP LB, CT-NG, RFX HPV ASCU
Chlamydia, Nuc. Acid Amp: NEGATIVE
Gonococcus, Nuc. Acid Amp: NEGATIVE
PAP Smear Comment: 0

## 2017-01-01 ENCOUNTER — Ambulatory Visit (INDEPENDENT_AMBULATORY_CARE_PROVIDER_SITE_OTHER): Payer: 59 | Admitting: Obstetrics and Gynecology

## 2017-01-01 ENCOUNTER — Ambulatory Visit (INDEPENDENT_AMBULATORY_CARE_PROVIDER_SITE_OTHER): Payer: 59

## 2017-01-01 VITALS — BP 110/60 | Wt 150.0 lb

## 2017-01-01 DIAGNOSIS — Z3A08 8 weeks gestation of pregnancy: Secondary | ICD-10-CM | POA: Diagnosis not present

## 2017-01-01 DIAGNOSIS — Z3689 Encounter for other specified antenatal screening: Secondary | ICD-10-CM | POA: Diagnosis not present

## 2017-01-01 DIAGNOSIS — Z3A09 9 weeks gestation of pregnancy: Secondary | ICD-10-CM

## 2017-01-01 DIAGNOSIS — Z3480 Encounter for supervision of other normal pregnancy, unspecified trimester: Secondary | ICD-10-CM

## 2017-01-01 NOTE — Progress Notes (Signed)
Prenatal Visit Note Date: 01/01/2017 Clinic: [facility]  Subjective:  Lynn CyphersKelsey B Jacquez is a 21 y.o. G2P1001 at 25365w1d being seen today for ongoing prenatal care.  She is currently monitored for the following issues for this low-risk pregnancy and has Supervision of other normal pregnancy, antepartum on her problem list.   ----------------------------------------------------------------------------------- Patient reports no complaints.   Contractions: Not present. Vag. Bleeding: None.   . Denies leaking of fluid.  U/S confirms EDD today. Wants more time to decide on genetic screening. ----------------------------------------------------------------------------------- The following portions of the patient's history were reviewed and updated as appropriate: allergies, current medications, past family history, past medical history, past social history, past surgical history and problem list. Problem list updated.  Objective:   Vitals:   01/01/17 1129  BP: 110/60  Weight: 150 lb (68 kg)   Total weight gain for pregnancy: 0 lb (0 kg)   Fetal Status: Fetal Heart Rate (bpm): Present         General:  Alert, oriented and cooperative. Patient is in no acute distress.  Skin: Skin is warm and dry. No rash noted.   Cardiovascular: Normal heart rate noted  Respiratory: Normal respiratory effort, no problems with respiration noted  Abdomen: Soft, gravid, appropriate for gestational age. Pain/Pressure: Absent     Pelvic:  Cervical exam deferred        Extremities: Normal range of motion.     Mental Status: Normal mood and affect. Normal behavior. Normal judgment and thought content.   Urinalysis: Urine Protein: Negative Urine Glucose: Negative  Assessment and Plan:  Pregnancy: G2P1001 at 38365w1d  1. Supervision of other normal pregnancy, antepartum 2. [redacted] weeks gestation of pregnancy  Please refer to After Visit Summary for other counseling recommendations.   Follow up 4 weeks for routine  prenatal  Thomasene MohairStephen Jackson, MD 01/01/2017 11:56 AM

## 2017-02-01 ENCOUNTER — Ambulatory Visit (INDEPENDENT_AMBULATORY_CARE_PROVIDER_SITE_OTHER): Payer: 59 | Admitting: Obstetrics and Gynecology

## 2017-02-01 VITALS — BP 112/56 | Wt 147.0 lb

## 2017-02-01 DIAGNOSIS — Z3A13 13 weeks gestation of pregnancy: Secondary | ICD-10-CM

## 2017-02-01 DIAGNOSIS — Z3480 Encounter for supervision of other normal pregnancy, unspecified trimester: Secondary | ICD-10-CM

## 2017-02-01 NOTE — Progress Notes (Signed)
    Routine Prenatal Care Visit  Subjective  Lynn Rice is a 21 y.o. G2P1001 at 1532w4d being seen today for ongoing prenatal care.  She is currently monitored for the following issues for this low-risk pregnancy and has Supervision of other normal pregnancy, antepartum on her problem list.  ----------------------------------------------------------------------------------- Patient reports headache.   Contractions: Not present. Vag. Bleeding: None.   . Denies leaking of fluid.  ----------------------------------------------------------------------------------- The following portions of the patient's history were reviewed and updated as appropriate: allergies, current medications, past family history, past medical history, past social history, past surgical history and problem list. Problem list updated.   Objective  Blood pressure (!) 112/56, weight 147 lb (66.7 kg), last menstrual period 10/29/2016. Pregravid weight 150 lb (68 kg) Total Weight Gain -3 lb (-1.361 kg) Urinalysis: Urine Protein: Negative Urine Glucose: Negative  Fetal Status: Fetal Heart Rate (bpm): 160         General:  Alert, oriented and cooperative. Patient is in no acute distress.  Skin: Skin is warm and dry. No rash noted.   Cardiovascular: Normal heart rate noted  Respiratory: Normal respiratory effort, no problems with respiration noted  Abdomen: Soft, gravid, appropriate for gestational age. Pain/Pressure: Absent     Pelvic:  Cervical exam deferred        Extremities: Normal range of motion.     ental Status: Normal mood and affect. Normal behavior. Normal judgment and thought content.     Assessment   21 y.o. G2P1001 at 132w4d by  08/05/2017, by Last Menstrual Period presenting for routine prenatal visit  Plan   Pregnancy #2 Problems (from 10/29/16 to present)    Problem Noted Resolved   Supervision of other normal pregnancy, antepartum 12/26/2016 by Vena AustriaStaebler, Zayli Villafuerte, MD No   Overview Addendum  01/01/2017 11:43 AM by Conard NovakJackson, Stephen D, MD    Clinic [facility] Prenatal Labs  Dating L=9 Blood type: O pos  Genetic Screen 1 Screen:    AFP:     Quad:     NIPS: Antibody:negative  Anatomic US  Rubella: Non-immune  Varicella: Immune  GTT Early:               Third trimester:  RPR: NR  Rhogam  HBsAg: negative  TDaP vaccine                       Flu Shot: HIV: negative  Baby Food                                GBS:   Contraception  Pap: 12/24/16 NIL  CBB     CS/VBAC    Support Person                   Preterm labor symptoms and general obstetric precautions including but not limited to vaginal bleeding, contractions, leaking of fluid and fetal movement were reviewed in detail with the patient. Please refer to After Visit Summary for other counseling recommendations.  - discussed magnesium supplementation for headaches - If fails to improve with magnesium and tylenol consider adding compazine  Return in about 4 weeks (around 03/01/2017) for ROB.

## 2017-02-01 NOTE — Progress Notes (Signed)
ROB Headaches 

## 2017-03-01 ENCOUNTER — Ambulatory Visit (INDEPENDENT_AMBULATORY_CARE_PROVIDER_SITE_OTHER): Payer: 59 | Admitting: Obstetrics and Gynecology

## 2017-03-01 VITALS — BP 100/58 | Wt 152.0 lb

## 2017-03-01 DIAGNOSIS — Z3480 Encounter for supervision of other normal pregnancy, unspecified trimester: Secondary | ICD-10-CM

## 2017-03-01 DIAGNOSIS — Z3A17 17 weeks gestation of pregnancy: Secondary | ICD-10-CM

## 2017-03-01 DIAGNOSIS — Z363 Encounter for antenatal screening for malformations: Secondary | ICD-10-CM

## 2017-03-01 NOTE — Progress Notes (Signed)
ROB Spotting after intercourse

## 2017-03-01 NOTE — Progress Notes (Signed)
    Routine Prenatal Care Visit  Subjective  Lynn Rice is a 21 y.o. G2P1001 at [redacted]w[redacted]d being seen today for ongoing prenatal care.  She is currently monitored for the following issues for this low-risk pregnancy and has Supervision of other normal pregnancy, antepartum on her problem list.  ----------------------------------------------------------------------------------- Patient reports bleeding.  Following intercourse, self limited, no cramping.   Headaches improved with addition of magnesium Contractions: Not present. Vag. Bleeding: Scant.  Movement: Absent. Denies leaking of fluid.  ----------------------------------------------------------------------------------- The following portions of the patient's history were reviewed and updated as appropriate: allergies, current medications, past family history, past medical history, past social history, past surgical history and problem list. Problem list updated.   Objective  Blood pressure (!) 100/58, weight 152 lb (68.9 kg), last menstrual period 10/29/2016. Pregravid weight 150 lb (68 kg) Total Weight Gain 2 lb (0.907 kg) Urinalysis:      Fetal Status: Fetal Heart Rate (bpm): 155   Movement: Absent     General:  Alert, oriented and cooperative. Patient is in no acute distress.  Skin: Skin is warm and dry. No rash noted.   Cardiovascular: Normal heart rate noted  Respiratory: Normal respiratory effort, no problems with respiration noted  Abdomen: Soft, gravid, appropriate for gestational age. Pain/Pressure: Absent     Pelvic:  Cervical exam deferred        Extremities: Normal range of motion.     ental Status: Normal mood and affect. Normal behavior. Normal judgment and thought content.     Assessment   20 y.o. G2P1001 at [redacted]w[redacted]d by  08/05/2017, by Last Menstrual Period presenting for routine prenatal visit  Plan   Pregnancy #2 Problems (from 10/29/16 to present)    Problem Noted Resolved   Supervision of other normal  pregnancy, antepartum 12/26/2016 by Vena Austria, MD No   Overview Addendum 01/01/2017 11:43 AM by Conard Novak, MD    Clinic [facility] Prenatal Labs  Dating L=9 Blood type: O pos  Genetic Screen 1 Screen:    AFP:     Quad:     NIPS: Antibody:negative  Anatomic [place]  Rubella: Non-immune  Varicella: Immune  GTT Early:               Third trimester:  RPR: NR  Rhogam  HBsAg: negative  TDaP vaccine                       Flu Shot: HIV: negative  Baby Food                                GBS:   Contraception  Pap: 12/24/16 NIL  CBB     CS/VBAC    Support Person                   Preterm labor symptoms and general obstetric precautions including but not limited to vaginal bleeding, contractions, leaking of fluid and fetal movement were reviewed in detail with the patient. Please refer to After Visit Summary for other counseling recommendations.  - Rh positive rhogam not indicated - Anatomy scan next visit Return in about 2 years (around 03/02/2019) for 2-3 weeks anatomy scan.

## 2017-03-06 ENCOUNTER — Telehealth: Payer: Self-pay

## 2017-03-06 NOTE — Telephone Encounter (Signed)
Pt is 18wks having a lot of back pain down into butt and leg. [phone].  Adv heat/ice to area 30min/hr (not to sleep c heat), e.s. Tylenol q4-6hrs while awake. It no better to call for appt Fri am.

## 2017-03-20 ENCOUNTER — Ambulatory Visit (INDEPENDENT_AMBULATORY_CARE_PROVIDER_SITE_OTHER): Payer: 59 | Admitting: Obstetrics and Gynecology

## 2017-03-20 ENCOUNTER — Ambulatory Visit (INDEPENDENT_AMBULATORY_CARE_PROVIDER_SITE_OTHER): Payer: 59

## 2017-03-20 VITALS — BP 104/68 | Wt 155.0 lb

## 2017-03-20 DIAGNOSIS — Z3480 Encounter for supervision of other normal pregnancy, unspecified trimester: Secondary | ICD-10-CM

## 2017-03-20 DIAGNOSIS — Z363 Encounter for antenatal screening for malformations: Secondary | ICD-10-CM

## 2017-03-20 DIAGNOSIS — Z3A20 20 weeks gestation of pregnancy: Secondary | ICD-10-CM

## 2017-03-20 NOTE — Progress Notes (Signed)
  Routine Prenatal Care Visit  Subjective  Lynn Rice is a 21 y.o. G2P1001 at 2431w2d being seen today for ongoing prenatal care.  She is currently monitored for the following issues for this low-risk pregnancy and has Supervision of other normal pregnancy, antepartum on her problem list.  ----------------------------------------------------------------------------------- Patient reports no complaints.  Improving lower-back, leg pain Contractions: Not present. Vag. Bleeding: None.  Movement: Present. Denies leaking of fluid.  Anatomy u/s incomplete.  F/u next visit. Out of flu vaccine today. Will get at next visit ----------------------------------------------------------------------------------- The following portions of the patient's history were reviewed and updated as appropriate: allergies, current medications, past family history, past medical history, past social history, past surgical history and problem list. Problem list updated. Objective  Blood pressure 104/68, weight 155 lb (70.3 kg), last menstrual period 10/29/2016. Pregravid weight 150 lb (68 kg) Total Weight Gain 5 lb (2.268 kg) Urinalysis: Urine Protein: Negative Urine Glucose: Negative  Fetal Status: Fetal Heart Rate (bpm): Present   Movement: Present     General:  Alert, oriented and cooperative. Patient is in no acute distress.  Skin: Skin is warm and dry. No rash noted.   Cardiovascular: Normal heart rate noted  Respiratory: Normal respiratory effort, no problems with respiration noted  Abdomen: Soft, gravid, appropriate for gestational age. Pain/Pressure: Present     Pelvic:  Cervical exam deferred        Extremities: Normal range of motion.     Mental Status: Normal mood and affect. Normal behavior. Normal judgment and thought content.   Assessment   21 y.o. G2P1001 at 7031w2d by  08/05/2017, by Last Menstrual Period presenting for routine prenatal visit  Plan   Pregnancy #2 Problems (from 10/29/16 to  present)    Problem Noted Resolved   Supervision of other normal pregnancy, antepartum 12/26/2016 by Vena AustriaStaebler, Andreas, MD No   Overview Addendum 03/20/2017  2:57 PM by Conard NovakJackson, Razan Siler D, MD    Clinic [facility] Prenatal Labs  Dating L=9 Blood type: O pos  Genetic Screen 1 Screen:    AFP:     Quad:     NIPS: Antibody:negative  Anatomic US Incomplete @ 20 wks [ ]  repeat Rubella: Non-immune  Varicella: Immune  GTT Early:               Third trimester:  RPR: NR  Rhogam  HBsAg: negative  TDaP vaccine                       Flu Shot: HIV: negative  Baby Food                                GBS:   Contraception  Pap: 12/24/16 NIL          Preterm labor symptoms and general obstetric precautions including but not limited to vaginal bleeding, contractions, leaking of fluid and fetal movement were reviewed in detail with the patient. Please refer to After Visit Summary for other counseling recommendations.   Return in about 2 weeks (around 04/03/2017) for schedule completion anatomy u/s and routine prenatal.  Thomasene MohairStephen Waqas Bruhl, MD  03/20/2017 3:02 PM

## 2017-04-03 ENCOUNTER — Encounter: Payer: Self-pay | Admitting: Obstetrics and Gynecology

## 2017-04-03 ENCOUNTER — Ambulatory Visit (INDEPENDENT_AMBULATORY_CARE_PROVIDER_SITE_OTHER): Payer: 59 | Admitting: Obstetrics and Gynecology

## 2017-04-03 ENCOUNTER — Ambulatory Visit (INDEPENDENT_AMBULATORY_CARE_PROVIDER_SITE_OTHER): Payer: 59

## 2017-04-03 VITALS — BP 110/64 | Wt 156.0 lb

## 2017-04-03 DIAGNOSIS — Z3480 Encounter for supervision of other normal pregnancy, unspecified trimester: Secondary | ICD-10-CM

## 2017-04-03 DIAGNOSIS — Z362 Encounter for other antenatal screening follow-up: Secondary | ICD-10-CM

## 2017-04-03 DIAGNOSIS — Z3A22 22 weeks gestation of pregnancy: Secondary | ICD-10-CM

## 2017-04-03 DIAGNOSIS — Z113 Encounter for screening for infections with a predominantly sexual mode of transmission: Secondary | ICD-10-CM

## 2017-04-03 DIAGNOSIS — Z131 Encounter for screening for diabetes mellitus: Secondary | ICD-10-CM

## 2017-04-03 NOTE — Progress Notes (Signed)
  Routine Prenatal Care Visit  Subjective  Lynn Rice is a [age] y.o. G2P1001 at 834w2d being seen today for ongoing prenatal care.  She is currently monitored for the following issues for this low-risk pregnancy and has Supervision of other normal pregnancy, antepartum on their problem list.  ----------------------------------------------------------------------------------- Patient reports no complaints.   Contractions: Not present. Vag. Bleeding: None.  Movement: Present. Denies leaking of fluid.  Out of flu shots again.  Will get next visit.  ----------------------------------------------------------------------------------- The following portions of the patient's history were reviewed and updated as appropriate: allergies, current medications, past family history, past medical history, past social history, past surgical history and problem list. Problem list updated.   Objective  Blood pressure 110/64, weight 156 lb (70.8 kg), last menstrual period 10/29/2016. Pregravid weight 150 lb (68 kg) Total Weight Gain 6 lb (2.722 kg) Urinalysis: Urine Protein: Negative Urine Glucose: Negative  Fetal Status: Fetal Heart Rate (bpm): Present   Movement: Present     General:  Alert, oriented and cooperative. Patient is in no acute distress.  Skin: Skin is warm and dry. No rash noted.   Cardiovascular: Normal heart rate noted  Respiratory: Normal respiratory effort, no problems with respiration noted  Abdomen: Soft, gravid, appropriate for gestational age. Pain/Pressure: Absent     Pelvic:  Cervical exam deferred        Extremities: Normal range of motion.     Mental Status: Normal mood and affect. Normal behavior. Normal judgment and thought content.   Assessment   21 y.o. G2P1001 at 6134w2d by  08/05/2017, by Last Menstrual Period presenting for routine prenatal visit  Plan   Pregnancy #2 Problems (from 10/29/16 to present)    Problem Noted Resolved   Supervision of other normal  pregnancy, antepartum 12/26/2016 by Vena AustriaStaebler, Andreas, MD No   Overview Addendum 04/03/2017  2:25 PM by Conard NovakJackson, Shelli Portilla D, MD    Clinic [facility] Prenatal Labs  Dating L=9 Blood type: O pos  Genetic Screen declined Antibody:negative  Anatomic US Incomplete @ 20 wks [x]  repeat, complete at 22w Rubella: Non-immune  Varicella: Immune  GTT Early:               Third trimester:  RPR: NR  Rhogam  HBsAg: negative  TDaP vaccine                       Flu Shot: HIV: negative  Baby Food                                GBS:   Contraception  Pap: 12/24/16 NIL  CBB     CS/VBAC    Support Person                Preterm labor symptoms and general obstetric precautions including but not limited to vaginal bleeding, contractions, leaking of fluid and fetal movement were reviewed in detail with the patient. Please refer to After Visit Summary for other counseling recommendations.   Return in about 4 weeks (around 05/01/2017) for schedule 28 week labs and routine prenatal visit.  Thomasene MohairStephen Iliany Losier, MD  04/03/2017 2:33 PM

## 2017-05-01 ENCOUNTER — Ambulatory Visit (INDEPENDENT_AMBULATORY_CARE_PROVIDER_SITE_OTHER): Payer: 59 | Admitting: Obstetrics and Gynecology

## 2017-05-01 ENCOUNTER — Encounter: Payer: Self-pay | Admitting: Obstetrics and Gynecology

## 2017-05-01 ENCOUNTER — Other Ambulatory Visit: Payer: 59

## 2017-05-01 VITALS — BP 116/70 | Wt 164.0 lb

## 2017-05-01 DIAGNOSIS — Z3A26 26 weeks gestation of pregnancy: Secondary | ICD-10-CM

## 2017-05-01 DIAGNOSIS — Z131 Encounter for screening for diabetes mellitus: Secondary | ICD-10-CM | POA: Diagnosis not present

## 2017-05-01 DIAGNOSIS — Z3480 Encounter for supervision of other normal pregnancy, unspecified trimester: Secondary | ICD-10-CM

## 2017-05-01 DIAGNOSIS — Z113 Encounter for screening for infections with a predominantly sexual mode of transmission: Secondary | ICD-10-CM | POA: Diagnosis not present

## 2017-05-01 NOTE — Progress Notes (Signed)
  Routine Prenatal Care Visit  Subjective  Lynn Rice is a 21 y.o. G2P1001 at 5655w2d being seen today for ongoing prenatal care.  She is currently monitored for the following issues for this low-risk pregnancy and has Supervision of other normal pregnancy, antepartum on their problem list.  ----------------------------------------------------------------------------------- Patient reports no complaints.   Contractions: Not present. Vag. Bleeding: None.  Movement: Present. Denies leaking of fluid.  ----------------------------------------------------------------------------------- The following portions of the patient's history were reviewed and updated as appropriate: allergies, current medications, past family history, past medical history, past social history, past surgical history and problem list. Problem list updated. Objective  Blood pressure 116/70, weight 164 lb (74.4 kg), last menstrual period 10/29/2016. Pregravid weight 150 lb (68 kg) Total Weight Gain 14 lb (6.35 kg) Urinalysis: Urine Protein: Negative Urine Glucose: Negative  Fetal Status: Fetal Heart Rate (bpm): 145 Fundal Height: 26 cm Movement: Present     General:  Alert, oriented and cooperative. Patient is in no acute distress.  Skin: Skin is warm and dry. No rash noted.   Cardiovascular: Normal heart rate noted  Respiratory: Normal respiratory effort, no problems with respiration noted  Abdomen: Soft, gravid, appropriate for gestational age. Pain/Pressure: Absent     Pelvic:  Cervical exam deferred        Extremities: Normal range of motion.     Mental Status: Normal mood and affect. Normal behavior. Normal judgment and thought content.   Assessment   21 y.o. G2P1001 at 2855w2d by  08/05/2017, by Last Menstrual Period presenting for routine prenatal visit  Plan   Pregnancy #2 Problems (from 10/29/16 to present)    Problem Noted Resolved   Supervision of other normal pregnancy, antepartum 12/26/2016 by Vena AustriaStaebler,  Andreas, MD No   Overview Addendum 04/03/2017  2:25 PM by Conard NovakJackson, Thurmond Hildebran D, MD    [facility]inic [facility] Prenatal Labs  Dating L=9 Blood type: O pos  Genetic Screen 1 Screen:    AFP:     Quad:     NIPS: Antibody:negative  Anatomic US Incomplete @ 20 wks [x]  repeat, complete at 22w Rubella: Non-immune  Varicella: Immune  GTT Early:               Third trimester:  RPR: NR  Rhogam  HBsAg: negative  TDaP vaccine                       Flu Shot: HIV: negative  Baby Food                                GBS:   Contraception  Pap: 12/24/16 NIL         Preterm labor symptoms and general obstetric precautions including but not limited to vaginal bleeding, contractions, leaking of fluid and fetal movement were reviewed in detail with the patient. Please refer to After Visit Summary for other counseling recommendations.   Return in about 2 weeks (around 05/15/2017) for Routine Prenatal Appointment.  Thomasene MohairStephen Kitzia Camus, MD  05/01/2017 3:19 PM

## 2017-05-02 LAB — 28 WEEK RH+PANEL
BASOS: 0 %
Basophils Absolute: 0 10*3/uL (ref 0.0–0.2)
EOS (ABSOLUTE): 0.1 10*3/uL (ref 0.0–0.4)
Eos: 1 %
Gestational Diabetes Screen: 122 mg/dL (ref 65–139)
HEMOGLOBIN: 10.8 g/dL — AB (ref 11.1–15.9)
HIV SCREEN 4TH GENERATION: NONREACTIVE
Hematocrit: 32.6 % — ABNORMAL LOW (ref 34.0–46.6)
IMMATURE GRANS (ABS): 0.1 10*3/uL (ref 0.0–0.1)
Immature Granulocytes: 1 %
LYMPHS: 16 %
Lymphocytes Absolute: 1.6 10*3/uL (ref 0.7–3.1)
MCH: 30.8 pg (ref 26.6–33.0)
MCHC: 33.1 g/dL (ref 31.5–35.7)
MCV: 93 fL (ref 79–97)
Monocytes Absolute: 0.5 10*3/uL (ref 0.1–0.9)
Monocytes: 5 %
NEUTROS ABS: 7.7 10*3/uL — AB (ref 1.4–7.0)
Neutrophils: 77 %
Platelets: 250 10*3/uL (ref 150–379)
RBC: 3.51 x10E6/uL — AB (ref 3.77–5.28)
RDW: 13.4 % (ref 12.3–15.4)
RPR Ser Ql: NONREACTIVE
WBC: 10 10*3/uL (ref 3.4–10.8)

## 2017-05-17 ENCOUNTER — Ambulatory Visit (INDEPENDENT_AMBULATORY_CARE_PROVIDER_SITE_OTHER): Payer: 59 | Admitting: Obstetrics and Gynecology

## 2017-05-17 VITALS — BP 104/60 | Wt 164.0 lb

## 2017-05-17 DIAGNOSIS — Z23 Encounter for immunization: Secondary | ICD-10-CM

## 2017-05-17 DIAGNOSIS — Z3480 Encounter for supervision of other normal pregnancy, unspecified trimester: Secondary | ICD-10-CM

## 2017-05-17 DIAGNOSIS — Z3A28 28 weeks gestation of pregnancy: Secondary | ICD-10-CM

## 2017-05-17 NOTE — Progress Notes (Signed)
ROB Flu vaccine today 

## 2017-05-17 NOTE — Progress Notes (Signed)
    Routine Prenatal Care Visit  Subjective  Marisa CyphersKelsey B Warrior is a 21 y.o. G2P1001 at 5610w4d being seen today for ongoing prenatal care.  She is currently monitored for the following issues for this low-risk pregnancy and has Supervision of other normal pregnancy, antepartum on their problem list.  ----------------------------------------------------------------------------------- Patient reports no complaints.   Contractions: Not present. Vag. Bleeding: None.  Movement: Present. Denies leaking of fluid.  ----------------------------------------------------------------------------------- The following portions of the patient's history were reviewed and updated as appropriate: allergies, current medications, past family history, past medical history, past social history, past surgical history and problem list. Problem list updated.   Objective  Blood pressure 104/60, weight 164 lb (74.4 kg), last menstrual period 10/29/2016. Pregravid weight 150 lb (68 kg) Total Weight Gain 14 lb (6.35 kg) Urinalysis: Urine Protein: Negative Urine Glucose: Negative  Fetal Status: Fetal Heart Rate (bpm): 145 Fundal Height: 28 cm Movement: Present     General:  Alert, oriented and cooperative. Patient is in no acute distress.  Skin: Skin is warm and dry. No rash noted.   Cardiovascular: Normal heart rate noted  Respiratory: Normal respiratory effort, no problems with respiration noted  Abdomen: Soft, gravid, appropriate for gestational age. Pain/Pressure: Present     Pelvic:  Cervical exam deferred        Extremities: Normal range of motion.     ental Status: Normal mood and affect. Normal behavior. Normal judgment and thought content.     Assessment   21 y.o. G2P1001 at 9710w4d by  08/05/2017, by Last Menstrual Period presenting for routine prenatal visit  Plan   Pregnancy #2 Problems (from 10/29/16 to present)    Problem Noted Resolved   Supervision of other normal pregnancy, antepartum 12/26/2016 by  Vena AustriaStaebler, Dantavious Snowball, MD No   Overview Addendum 04/03/2017  2:25 PM by Conard NovakJackson, Stephen D, MD    [facility]inic [facility] Prenatal Labs  Dating L=9 Blood type: O pos  Genetic Screen 1 Screen:    AFP:     Quad:     NIPS: Antibody:negative  Anatomic US Incomplete @ 20 wks [x]  repeat, complete at 22w Rubella: Non-immune  Varicella: Immune  GTT Early:               Third trimester:  RPR: NR  Rhogam  HBsAg: negative  TDaP vaccine                       Flu Shot: HIV: negative  Baby Food                                GBS:   Contraception  Pap: 12/24/16 NIL  CBB     CS/VBAC    Support Person                   Preterm labor symptoms and general obstetric precautions including but not limited to vaginal bleeding, contractions, leaking of fluid and fetal movement were reviewed in detail with the patient. Please refer to After Visit Summary for other counseling recommendations.  - Influenza vaccination Return in about 1 week (around 05/24/2017) for ROB.

## 2017-05-24 ENCOUNTER — Encounter: Payer: Self-pay | Admitting: Obstetrics and Gynecology

## 2017-05-24 ENCOUNTER — Ambulatory Visit (INDEPENDENT_AMBULATORY_CARE_PROVIDER_SITE_OTHER): Payer: 59 | Admitting: Obstetrics and Gynecology

## 2017-05-24 VITALS — BP 110/74 | Wt 166.0 lb

## 2017-05-24 DIAGNOSIS — Z3480 Encounter for supervision of other normal pregnancy, unspecified trimester: Secondary | ICD-10-CM

## 2017-05-24 DIAGNOSIS — Z3A29 29 weeks gestation of pregnancy: Secondary | ICD-10-CM

## 2017-05-24 NOTE — Progress Notes (Signed)
  Routine Prenatal Care Visit Subjective  Lynn Rice is a 21 y.o. G2P1001 at 5735w4d being seen today for ongoing prenatal care.  She is currently monitored for the following issues for this low-risk pregnancy and has Supervision of other normal pregnancy, antepartum on their problem list.  ----------------------------------------------------------------------------------- Patient reports no complaints.   Contractions: Not present. Vag. Bleeding: None.  Movement: Present. Denies leaking of fluid.  ----------------------------------------------------------------------------------- The following portions of the patient's history were reviewed and updated as appropriate: allergies, current medications, past family history, past medical history, past social history, past surgical history and problem list. Problem list updated.   Objective  Blood pressure 110/74, weight 166 lb (75.3 kg), last menstrual period 10/29/2016. Pregravid weight 150 lb (68 kg) Total Weight Gain 16 lb (7.258 kg) Urinalysis: Urine Protein: Negative Urine Glucose: Negative  Fetal Status: Fetal Heart Rate (bpm): 135 Fundal Height: 29 cm Movement: Present     General:  Alert, oriented and cooperative. Patient is in no acute distress.  Skin: Skin is warm and dry. No rash noted.   Cardiovascular: Normal heart rate noted  Respiratory: Normal respiratory effort, no problems with respiration noted  Abdomen: Soft, gravid, appropriate for gestational age. Pain/Pressure: Absent     Pelvic:  Cervical exam deferred        Extremities: Normal range of motion.     Mental Status: Normal mood and affect. Normal behavior. Normal judgment and thought content.   Assessment   21 y.o. G2P1001 at 4835w4d by  08/05/2017, by Last Menstrual Period presenting for routine prenatal visit  Plan   Pregnancy #2 Problems (from 10/29/16 to present)    Problem Noted Resolved   Supervision of other normal pregnancy, antepartum 12/26/2016 by  Vena AustriaStaebler, Andreas, MD No   Overview Addendum 05/17/2017 10:06 AM by Vena AustriaStaebler, Andreas, MD    [facility]inic [facility] Prenatal Labs  Dating L=9 Blood type: O pos  Genetic Screen 1 Screen:    AFP:     Quad:     NIPS: Antibody:negative  Anatomic US Incomplete @ 20 wks [x]  repeat, complete at 22w Rubella: Non-immune  Varicella: Immune  GTT Early:               Third trimester:  RPR: NR  Rhogam n/a HBsAg: negative  TDaP vaccine                        Flu Shot: 12.21.18 HIV: negative  Baby Food breast                 GBS:   Contraception pill Pap: 12/24/16 NIL  CBB     CS/VBAC    Support Person                Preterm labor symptoms and general obstetric precautions including but not limited to vaginal bleeding, contractions, leaking of fluid and fetal movement were reviewed in detail with the patient. Please refer to After Visit Summary for other counseling recommendations.   Return in about 2 weeks (around 06/07/2017) for Routine Prenatal Appointment.  -TDaP next visit - reviewed 28 week lab results.    Thomasene MohairStephen Luisana Lutzke, MD  05/24/2017 10:41 AM

## 2017-05-28 NOTE — L&D Delivery Note (Signed)
Delivery Note At 4:08 AM a viable female was delivered via Vaginal, Spontaneous (Presentation: LOA).  APGAR: 8, 9;  weight 7 lb 9 oz (3430 g).   Placenta status: delivered spontaneously, intact. Cord: 3VC with the following complications: none.  Cord pH: n/a  Anesthesia: epidural Episiotomy: None Lacerations: None Suture Repair: n/a Est. Blood Loss (mL): 425  Mom to postpartum.  Baby to Couplet care / Skin to Skin.  Called to see patient.  Mom pushed to deliver a viable female infant.  The head followed by shoulders, which delivered without difficulty, and the rest of the body.  No nuchal cord noted.  Baby to mom's chest.  Cord clamped and cut after > 1 min delay.  Cord blood obtained.  Placenta delivered spontaneously, intact, with a 3-vessel cord.  No vaginal, cervical, or perineal lacerations. All counts correct.  Hemostasis obtained with IV pitocin and fundal massage. Quantitative blood loss 425 mL.     Thomasene MohairStephen Genie Mirabal, MD, Merlinda FrederickFACOG [facility], [facility] 07/30/2017 4:28 AM

## 2017-06-07 ENCOUNTER — Encounter: Payer: 59 | Admitting: Obstetrics and Gynecology

## 2017-06-07 ENCOUNTER — Encounter: Payer: Self-pay | Admitting: Obstetrics and Gynecology

## 2017-06-07 ENCOUNTER — Ambulatory Visit (INDEPENDENT_AMBULATORY_CARE_PROVIDER_SITE_OTHER): Payer: 59 | Admitting: Obstetrics and Gynecology

## 2017-06-07 VITALS — BP 114/70 | Wt 165.0 lb

## 2017-06-07 DIAGNOSIS — Z23 Encounter for immunization: Secondary | ICD-10-CM

## 2017-06-07 DIAGNOSIS — Z3480 Encounter for supervision of other normal pregnancy, unspecified trimester: Secondary | ICD-10-CM

## 2017-06-07 DIAGNOSIS — Z3A31 31 weeks gestation of pregnancy: Secondary | ICD-10-CM

## 2017-06-07 NOTE — Patient Instructions (Signed)

## 2017-06-07 NOTE — Progress Notes (Signed)
  Routine Prenatal Care Visit  Subjective  Lynn Rice is a 22 y.o. G2P1001 at 8080w4d being seen today for ongoing prenatal care.  She is currently monitored for the following issues for this low-risk pregnancy and has Supervision of other normal pregnancy, antepartum on their problem list.  ----------------------------------------------------------------------------------- Patient reports no complaints.   Contractions: Not present. Vag. Bleeding: None.  Movement: Present. Denies leaking of fluid.  ----------------------------------------------------------------------------------- The following portions of the patient's history were reviewed and updated as appropriate: allergies, current medications, past family history, past medical history, past social history, past surgical history and problem list. Problem list updated.   Objective  Blood pressure 114/70, weight 165 lb (74.8 kg), last menstrual period 10/29/2016. Pregravid weight 150 lb (68 kg) Total Weight Gain 15 lb (6.804 kg) Urinalysis:      Fetal Status: Fetal Heart Rate (bpm): 135 Fundal Height: 32 cm Movement: Present     General:  Alert, oriented and cooperative. Patient is in no acute distress.  Skin: Skin is warm and dry. No rash noted.   Cardiovascular: Normal heart rate noted  Respiratory: Normal respiratory effort, no problems with respiration noted  Abdomen: Soft, gravid, appropriate for gestational age. Pain/Pressure: Absent     Pelvic:  Cervical exam deferred        Extremities: Normal range of motion.     Mental Status: Normal mood and affect. Normal behavior. Normal judgment and thought content.   Assessment   22 y.o. G2P1001 at 3480w4d by  08/05/2017, by Last Menstrual Period presenting for routine prenatal visit  Plan   Pregnancy #2 Problems (from 10/29/16 to present)    Problem Noted Resolved   Supervision of other normal pregnancy, antepartum 12/26/2016 by Vena AustriaStaebler, Andreas, MD No   Overview Addendum  05/17/2017 10:06 AM by Vena AustriaStaebler, Andreas, MD    [facility]inic [facility] Prenatal Labs  Dating L=9 Blood type: O pos  Genetic Screen 1 Screen:    AFP:     Quad:     NIPS: Antibody:negative  Anatomic US Incomplete @ 20 wks [x]  repeat, complete at 22w Rubella: Non-immune  Varicella: Immune  GTT Early:               Third trimester:  RPR: NR  Rhogam  HBsAg: negative  TDaP vaccine                       Flu Shot: 12.21.18 HIV: negative  Baby Food                                GBS:   Contraception  Pap: 12/24/16 NIL  CBB     CS/VBAC    Support Person                 Preterm labor symptoms and general obstetric precautions including but not limited to vaginal bleeding, contractions, leaking of fluid and fetal movement were reviewed in detail with the patient. Please refer to After Visit Summary for other counseling recommendations.   Return in about 2 weeks (around 06/21/2017) for Routine Prenatal Appointment.  - TDaP today  Thomasene MohairStephen Tangie Stay, MD  06/07/2017 8:35 AM

## 2017-06-07 NOTE — Addendum Note (Signed)
Addended by: Liliane ShiSHAW, Anne Boltz M on: 06/07/2017 08:39 AM   Modules accepted: Orders

## 2017-06-21 ENCOUNTER — Encounter: Payer: Self-pay | Admitting: Obstetrics and Gynecology

## 2017-06-21 ENCOUNTER — Ambulatory Visit (INDEPENDENT_AMBULATORY_CARE_PROVIDER_SITE_OTHER): Payer: 59 | Admitting: Obstetrics and Gynecology

## 2017-06-21 VITALS — BP 116/78 | Wt 172.0 lb

## 2017-06-21 DIAGNOSIS — Z3A33 33 weeks gestation of pregnancy: Secondary | ICD-10-CM

## 2017-06-21 DIAGNOSIS — Z3480 Encounter for supervision of other normal pregnancy, unspecified trimester: Secondary | ICD-10-CM

## 2017-06-21 NOTE — Progress Notes (Signed)
  Routine Prenatal Care Visit  Subjective  Lynn Rice is a 22 y.o. G2P1001 at 7256w4d being seen today for ongoing prenatal care.  She is currently monitored for the following issues for this low-risk pregnancy and has Supervision of other normal pregnancy, antepartum on their problem list.  ----------------------------------------------------------------------------------- Patient reports no complaints.   Contractions: Not present. Vag. Bleeding: None.  Movement: Present. Denies leaking of fluid.  ----------------------------------------------------------------------------------- The following portions of the patient's history were reviewed and updated as appropriate: allergies, current medications, past family history, past medical history, past social history, past surgical history and problem list. Problem list updated.   Objective  Blood pressure 116/78, weight 172 lb (78 kg), last menstrual period 10/29/2016. Pregravid weight 150 lb (68 kg) Total Weight Gain 22 lb (9.979 kg) Urinalysis: Urine Protein: Negative Urine Glucose: Negative  Fetal Status: Fetal Heart Rate (bpm): 135 Fundal Height: 33 cm Movement: Present     General:  Alert, oriented and cooperative. Patient is in no acute distress.  Skin: Skin is warm and dry. No rash noted.   Cardiovascular: Normal heart rate noted  Respiratory: Normal respiratory effort, no problems with respiration noted  Abdomen: Soft, gravid, appropriate for gestational age. Pain/Pressure: Absent     Pelvic:  Cervical exam deferred        Extremities: Normal range of motion.     Mental Status: Normal mood and affect. Normal behavior. Normal judgment and thought content.   Assessment   22 y.o. G2P1001 at 4456w4d by  08/05/2017, by Last Menstrual Period presenting for routine prenatal visit  Plan   Pregnancy #2 Problems (from 10/29/16 to present)    Problem Noted Resolved   Supervision of other normal pregnancy, antepartum 12/26/2016 by  Vena AustriaStaebler, Andreas, MD No   Overview Addendum 05/17/2017 10:06 AM by Vena AustriaStaebler, Andreas, MD    [facility] Prenatal Labs  Dating L=9 Blood type: O pos  Genetic Screen 1 Screen:    AFP:     Quad:     NIPS: Antibody:negative  Anatomic US Incomplete @ 20 wks [x]  repeat, complete at 22w Rubella: Non-immune  Varicella: Immune  GTT Early:               Third trimester:  RPR: NR  Rhogam  HBsAg: negative  TDaP vaccine                       Flu Shot: 12.21.18 HIV: negative  Baby Food                                GBS:   Contraception  Pap: 12/24/16 NIL  CBB     CS/VBAC    Support Person                   Preterm labor symptoms and general obstetric precautions including but not limited to vaginal bleeding, contractions, leaking of fluid and fetal movement were reviewed in detail with the patient. Please refer to After Visit Summary for other counseling recommendations.   Return in about 2 weeks (around 07/05/2017) for Routine Prenatal Appointment, may make next few appts at 1 wk intervals w Dr Neila GearJackson/Staebler.  Thomasene MohairStephen Shaterria Sager, MD  06/21/2017 10:31 AM

## 2017-07-05 ENCOUNTER — Encounter: Payer: Self-pay | Admitting: Obstetrics and Gynecology

## 2017-07-05 ENCOUNTER — Ambulatory Visit (INDEPENDENT_AMBULATORY_CARE_PROVIDER_SITE_OTHER): Payer: 59 | Admitting: Obstetrics and Gynecology

## 2017-07-05 VITALS — BP 122/70 | Wt 172.0 lb

## 2017-07-05 DIAGNOSIS — Z3480 Encounter for supervision of other normal pregnancy, unspecified trimester: Secondary | ICD-10-CM

## 2017-07-05 DIAGNOSIS — Z3A35 35 weeks gestation of pregnancy: Secondary | ICD-10-CM

## 2017-07-05 NOTE — Progress Notes (Signed)
  Routine Prenatal Care Visit  Subjective  Lynn Rice is a 22 y.o. G2P1001 at 6862w4d being seen today for ongoing prenatal care.  She is currently monitored for the following issues for this low-risk pregnancy and has Supervision of other normal pregnancy, antepartum on their problem list.  ----------------------------------------------------------------------------------- Patient reports no complaints.   Contractions: Not present. Vag. Bleeding: None.  Movement: Present. Denies leaking of fluid.  ----------------------------------------------------------------------------------- The following portions of the patient's history were reviewed and updated as appropriate: allergies, current medications, past family history, past medical history, past social history, past surgical history and problem list. Problem list updated.   Objective  Blood pressure 122/70, weight 172 lb (78 kg), last menstrual period 10/29/2016. Pregravid weight 150 lb (68 kg) Total Weight Gain 22 lb (9.979 kg) Urinalysis: Urine Protein: Negative Urine Glucose: Negative  Fetal Status: Fetal Heart Rate (bpm): 125 Fundal Height: 35 cm Movement: Present  Presentation: Vertex  General:  Alert, oriented and cooperative. Patient is in no acute distress.  Skin: Skin is warm and dry. No rash noted.   Cardiovascular: Normal heart rate noted  Respiratory: Normal respiratory effort, no problems with respiration noted  Abdomen: Soft, gravid, appropriate for gestational age. Pain/Pressure: Absent     Pelvic:  Cervical exam deferred        Extremities: Normal range of motion.     Mental Status: Normal mood and affect. Normal behavior. Normal judgment and thought content.   Assessment   22 y.o. G2P1001 at 7662w4d by  08/05/2017, by Last Menstrual Period presenting for routine prenatal visit  Plan   Pregnancy #2 Problems (from 10/29/16 to present)    Problem Noted Resolved   Supervision of other normal pregnancy, antepartum  12/26/2016 by Vena AustriaStaebler, Andreas, MD No   Overview Addendum 05/17/2017 10:06 AM by Vena AustriaStaebler, Andreas, MD    [facility] Prenatal Labs  Dating L=9 Blood type: O pos  Genetic Screen 1 Screen:    AFP:     Quad:     NIPS: Antibody:negative  Anatomic US Incomplete @ 20 wks [x]  repeat, complete at 22w Rubella: Non-immune  Varicella: Immune  GTT Early:               Third trimester:  RPR: NR  Rhogam  HBsAg: negative  TDaP vaccine                       Flu Shot: 12.21.18 HIV: negative  Baby Food                                GBS:   Contraception  Pap: 12/24/16 NIL  CBB     CS/VBAC    Support Person                   Preterm labor symptoms and general obstetric precautions including but not limited to vaginal bleeding, contractions, leaking of fluid and fetal movement were reviewed in detail with the patient. Please refer to After Visit Summary for other counseling recommendations.   Return in about 1 week (around 07/12/2017) for Routine Prenatal Appointment.  Thomasene MohairStephen Maddisen Vought, MD  07/05/2017 11:12 AM

## 2017-07-10 ENCOUNTER — Ambulatory Visit (INDEPENDENT_AMBULATORY_CARE_PROVIDER_SITE_OTHER): Payer: 59 | Admitting: Obstetrics and Gynecology

## 2017-07-10 VITALS — BP 100/66 | Wt 174.0 lb

## 2017-07-10 DIAGNOSIS — O09299 Supervision of pregnancy with other poor reproductive or obstetric history, unspecified trimester: Secondary | ICD-10-CM

## 2017-07-10 DIAGNOSIS — Z3480 Encounter for supervision of other normal pregnancy, unspecified trimester: Secondary | ICD-10-CM

## 2017-07-10 DIAGNOSIS — Z113 Encounter for screening for infections with a predominantly sexual mode of transmission: Secondary | ICD-10-CM | POA: Diagnosis not present

## 2017-07-10 DIAGNOSIS — Z3A36 36 weeks gestation of pregnancy: Secondary | ICD-10-CM | POA: Diagnosis not present

## 2017-07-10 DIAGNOSIS — Z3685 Encounter for antenatal screening for Streptococcus B: Secondary | ICD-10-CM

## 2017-07-10 NOTE — Progress Notes (Signed)
ROB GBS/Aptima today 

## 2017-07-10 NOTE — Progress Notes (Signed)
     Routine Prenatal Care Visit  Subjective  Lynn Rice is a 22 y.o. G2P1001 at 8273w2d being seen today for ongoing prenatal care.  She is currently monitored for the following issues for this low-risk pregnancy and has Supervision of other normal pregnancy, antepartum on their problem list.  ----------------------------------------------------------------------------------- Patient reports no complaints.   Contractions: Not present. Vag. Bleeding: None.  Movement: Present. Denies leaking of fluid.  ----------------------------------------------------------------------------------- The following portions of the patient's history were reviewed and updated as appropriate: allergies, current medications, past family history, past medical history, past social history, past surgical history and problem list. Problem list updated.   Objective  Blood pressure 100/66, weight 174 lb (78.9 kg), last menstrual period 10/29/2016. Pregravid weight 150 lb (68 kg) Total Weight Gain 24 lb (10.9 kg) Urinalysis: Urine Protein: Negative Urine Glucose: Negative  Fetal Status: Fetal Heart Rate (bpm): 130 Fundal Height: 36 cm Movement: Present  Presentation: Vertex  General:  Alert, oriented and cooperative. Patient is in no acute distress.  Skin: Skin is warm and dry. No rash noted.   Cardiovascular: Normal heart rate noted  Respiratory: Normal respiratory effort, no problems with respiration noted  Abdomen: Soft, gravid, appropriate for gestational age. Pain/Pressure: Absent     Pelvic:  Cervical exam performed Dilation: 1 Effacement (%): 50 Station: -3  Extremities: Normal range of motion.     ental Status: Normal mood and affect. Normal behavior. Normal judgment and thought content.   Immunization History  Administered Date(s) Administered  . HPV Quadrivalent 11/13/2011, 01/14/2012, 12/10/2012  . Influenza,inj,Quad PF,6+ Mos 05/17/2017  . Tdap 06/07/2017     Assessment   21 y.o. G2P1001  at 973w2d by  08/05/2017, by Last Menstrual Period presenting for routine prenatal visit  Plan   Pregnancy #2 Problems (from 10/29/16 to present)    Problem Noted Resolved   Supervision of other normal pregnancy, antepartum 12/26/2016 by Vena AustriaStaebler, Alka Falwell, MD No   Overview Addendum 05/17/2017 10:06 AM by Vena AustriaStaebler, Ashika Apuzzo, MD    [facility] Prenatal Labs  Dating L=9 Blood type: O pos  Genetic Screen 1 Screen:    AFP:     Quad:     NIPS: Antibody:negative  Anatomic US Incomplete @ 20 wks [x]  repeat, complete at 22w Rubella: Non-immune  Varicella: Immune  GTT 122 RPR: NR  Rhogam N/A HBsAg: negative  TDaP vaccine 06/07/2017 Flu Shot: 12.21.18 HIV: negative  Baby Food                                GBS:   Contraception  Pap: 12/24/16 NIL  CBB     CS/VBAC    Support Person                   Gestational age appropriate obstetric precautions including but not limited to vaginal bleeding, contractions, leaking of fluid and fetal movement were reviewed in detail with the patient.   - GBS and aptima today - growth ultrasound today G1 8lbs 13oz  Return in about 1 week (around 07/17/2017) for ROB and growth scan.  Vena AustriaAndreas Tura Roller, MD, Merlinda FrederickFACOG [facility], [facility] 07/10/2017, 5:16 PM

## 2017-07-12 ENCOUNTER — Encounter: Payer: Self-pay | Admitting: Obstetrics and Gynecology

## 2017-07-12 LAB — GC/CHLAMYDIA PROBE AMP
CHLAMYDIA, DNA PROBE: NEGATIVE
Neisseria gonorrhoeae by PCR: NEGATIVE

## 2017-07-12 LAB — STREP GP B NAA: STREP GROUP B AG: NEGATIVE

## 2017-07-17 ENCOUNTER — Ambulatory Visit (INDEPENDENT_AMBULATORY_CARE_PROVIDER_SITE_OTHER): Payer: 59 | Admitting: Obstetrics and Gynecology

## 2017-07-17 ENCOUNTER — Encounter: Payer: Self-pay | Admitting: Obstetrics and Gynecology

## 2017-07-17 ENCOUNTER — Ambulatory Visit (INDEPENDENT_AMBULATORY_CARE_PROVIDER_SITE_OTHER): Payer: 59

## 2017-07-17 VITALS — BP 114/70 | Wt 177.0 lb

## 2017-07-17 DIAGNOSIS — O09299 Supervision of pregnancy with other poor reproductive or obstetric history, unspecified trimester: Secondary | ICD-10-CM | POA: Diagnosis not present

## 2017-07-17 DIAGNOSIS — Z3A37 37 weeks gestation of pregnancy: Secondary | ICD-10-CM

## 2017-07-17 DIAGNOSIS — Z3A36 36 weeks gestation of pregnancy: Secondary | ICD-10-CM

## 2017-07-17 DIAGNOSIS — Z3480 Encounter for supervision of other normal pregnancy, unspecified trimester: Secondary | ICD-10-CM | POA: Diagnosis not present

## 2017-07-17 NOTE — Progress Notes (Signed)
Routine Prenatal Care Visit  Subjective  Lynn Rice is a 22 y.o. G2P1001 at 2660w3d being seen today for ongoing prenatal care.  She is currently monitored for the following issues for this low-risk pregnancy and has Supervision of other normal pregnancy, antepartum on their problem list.  ----------------------------------------------------------------------------------- Patient reports no complaints.   Contractions: Not present. Vag. Bleeding: None.  Movement: Present. Denies leaking of fluid.  U/S shows growth at 3,397 grams (72nd %ile), AFI 8.9 cm.  Cephalic. ----------------------------------------------------------------------------------- The following portions of the patient's history were reviewed and updated as appropriate: allergies, current medications, past family history, past medical history, past social history, past surgical history and problem list. Problem list updated.   Objective  Blood pressure 114/70, weight 177 lb (80.3 kg), last menstrual period 10/29/2016. Pregravid weight 150 lb (68 kg) Total Weight Gain 27 lb (12.2 kg) Urinalysis: Urine Protein: Negative Urine Glucose: Negative  Fetal Status: Fetal Heart Rate (bpm): present Fundal Height: 37 cm Movement: Present     General:  Alert, oriented and cooperative. Patient is in no acute distress.  Skin: Skin is warm and dry. No rash noted.   Cardiovascular: Normal heart rate noted  Respiratory: Normal respiratory effort, no problems with respiration noted  Abdomen: Soft, gravid, appropriate for gestational age. Pain/Pressure: Absent     Pelvic:  Cervical exam deferred        Extremities: Normal range of motion.     Mental Status: Normal mood and affect. Normal behavior. Normal judgment and thought content.   Assessment   22 y.o. G2P1001 at 6760w3d by  08/05/2017, by Last Menstrual Period presenting for routine prenatal visit  Plan   Pregnancy #2 Problems (from 10/29/16 to present)    Problem Noted  Resolved   Supervision of other normal pregnancy, antepartum 12/26/2016 by Vena AustriaStaebler, Andreas, MD No   Overview Addendum 07/12/2017  9:53 AM by Vena AustriaStaebler, Andreas, MD    [facility] Prenatal Labs  Dating L=9 Blood type: O pos  Genetic Screen 1 Screen:    AFP:     Quad:     NIPS: Antibody:negative  Anatomic US Incomplete @ 20 wks [x]  repeat, complete at 22w Rubella: Non-immune  Varicella: Immune  GTT 122 RPR: NR  Rhogam N/A HBsAg: negative  TDaP vaccine 06/07/2017 Flu Shot: 05/17/2017 HIV: negative  Baby Food                                GBS: negative  Contraception  Pap: 12/24/16 NIL  CBB   Pelvis tested to 8lbs 13oz  CS/VBAC    Support Person                   Term labor symptoms and general obstetric precautions including but not limited to vaginal bleeding, contractions, leaking of fluid and fetal movement were reviewed in detail with the patient. Please refer to After Visit Summary for other counseling recommendations.   -Discussed elective IOL for possible macrosomia, if goes to 41 weeks.  Given new studies showing improvement in outcomes with elective IOL at 39 weeks, I think it is reasonable to set her up for induction. She is schedule for 3/4 at 8am for IOL. Will perform cervical exam next appointment to assess plan for IOL.  Discussed benefits/risks of awaiting spontaneous labor. I think she would be fine to wait. However, she is somewhat apprehensive about waiting. So, induction has been arranged.   Return in about  1 week (around 07/24/2017) for Routine Prenatal Appointment.  Thomasene Mohair, MD  07/18/2017 9:55 AM

## 2017-07-18 NOTE — Progress Notes (Signed)
  [facility] IN[facility] SCHEDULING Marisa CyphersKelsey B Besse 10-11-1995 Medical record #: [id number] Phone #:   Home Phone [phone]  Work Phone [phone]  Mobile [phone]    [facility]atal Provider:[facility] Proposed admission date/time:07/29/17 @ 8am Method of induction:Cytotec  Weight: Filed Weights02/20/19 1516Weight:177 lb (80.3 kg) BMI Body mass index is 29.45 kg/m. HIV Negative HSV Negative EDC Estimated Date of Delivery: 3/11/19based on:LMP  Gestational age on admission: 5939 Gravidity/parity:G2P1001  Cervix Score   0 1 2 3   Position Posterior Midposition Anterior   Consistency Firm Medium Soft   Effacement (%) 0-30 40-50 60-70 >80  Dilation (cm) Closed 1-2 3-4 >5  Baby's station -3 -2 -1 +1, +2   Bishop Score:4   Medical induction of labor  select indication(s) below Elective induction ?39 weeks multiparous patient ?39 weeks primiparous patient with Bishop score ?7 ?40 weeks primiparous patient  Medical Indications Adapted from ACOG Committee Opinion #560, "Medically Indicated Late Preterm and Early Term Deliveries," 2013.  PLACENTAL / UTERINE ISSUES FETAL ISSUES MATERNAL ISSUES  ? 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 Isoimmunization (37.0-38.6) ? Preeclampsia without severe features or gestational HTN (37.0)  ? Suspected accreta (34.0-35.6) ? Growth Restriction Mason Jim([place]) ? Preeclampsia with severe features (34.0)  ? Prior classical CD, uterine window, rupture (36.0-37.6) ? Isolated (38.0-39.6) ? Chronic HTN (38.0-39.6)  ? Prior myomectomy (37.0-38.6) ? Concurrent findings (34.0-37.6) ? Cholestasis (37.0)  ? Umbilical vein varix (37.0) ? Growth Restriction (Twins) ? Diabetes  ? Placental abruption (chronic) ? Di-Di Isolated (36.0-37.6) ? Pregestational, controlled (39.0)  OBSTETRIC ISSUES ? Di-Di concurrent findings (32.0-34.6) ? Pregestational, uncontrolled (37.0-39.0)  ? Postdates ? (41 weeks) ? Mo-Di isolated  (32.0-34.6) ? Pregestational, vascular compromise (37.0- 39.0)  ? PPROM (34.0) ? Multiple Gestation ? Gestational, diet controlled (40.0)  ? Hx of IUFD (39.0 weeks) ? Di-Di (38.0-38.6) ? Gestational, med controlled (39.0)  ? Polyhydramnios, mild/moderate; SDV 8-16 or AFI 25-35 (39.0) ? Mo-Di (36.0-37.6) ? Gestational, uncontrolled (38.0-39.0)  ? Oligohydramnios (36.0-37.6); MVP <2 cm  For indications not listed above, delivery recommendations from maternal-fetal medicine consultant occurred on: Date:n/a with Dr. Gretta Cooln/a for indication of:n/a  Provider Signature: Thomasene MohairStephen Mileydi Milsap Scheduled BM:WUXLKGMWby:Brittany, RN Date:07/18/2017 9:59 AM   Call [phone] to finalize the induction date/time  [id number] (07/17)

## 2017-07-19 ENCOUNTER — Encounter: Payer: 59 | Admitting: Obstetrics and Gynecology

## 2017-07-26 ENCOUNTER — Ambulatory Visit (INDEPENDENT_AMBULATORY_CARE_PROVIDER_SITE_OTHER): Payer: 59 | Admitting: Obstetrics and Gynecology

## 2017-07-26 ENCOUNTER — Encounter: Payer: Self-pay | Admitting: Obstetrics and Gynecology

## 2017-07-26 VITALS — BP 118/74 | Wt 180.0 lb

## 2017-07-26 DIAGNOSIS — Z3A38 38 weeks gestation of pregnancy: Secondary | ICD-10-CM

## 2017-07-26 DIAGNOSIS — Z3480 Encounter for supervision of other normal pregnancy, unspecified trimester: Secondary | ICD-10-CM

## 2017-07-26 NOTE — Progress Notes (Signed)
  Routine Prenatal Care Visit  Subjective  Lynn Rice is a 22 y.o. G2P1001 at 8765w0d being seen today for ongoing prenatal care.  She is currently monitored for the following issues for this low-risk pregnancy and has Supervision of other normal pregnancy, antepartum and Labor and delivery, indication for care on their problem list.  ----------------------------------------------------------------------------------- Patient reports no complaints.   Contractions: Not present. Vag. Bleeding: None.  Movement: Present. Denies leaking of fluid.  ----------------------------------------------------------------------------------- The following portions of the patient's history were reviewed and updated as appropriate: allergies, current medications, past family history, past medical history, past social history, past surgical history and problem list. Problem list updated.   Objective  Blood pressure 118/74, weight 180 lb (81.6 kg), last menstrual period 10/29/2016. Pregravid weight 150 lb (68 kg) Total Weight Gain 30 lb (13.6 kg) Urinalysis: Urine Protein: Negative Urine Glucose: Negative  Fetal Status: Fetal Heart Rate (bpm): 145 Fundal Height: 38 cm Movement: Present  Presentation: Vertex  General:  Alert, oriented and cooperative. Patient is in no acute distress.  Skin: Skin is warm and dry. No rash noted.   Cardiovascular: Normal heart rate noted  Respiratory: Normal respiratory effort, no problems with respiration noted  Abdomen: Soft, gravid, appropriate for gestational age. Pain/Pressure: Present     Pelvic:  Cervical exam performed Dilation: 1 Effacement (%): 50 Station: -3  Extremities: Normal range of motion.     Mental Status: Normal mood and affect. Normal behavior. Normal judgment and thought content.   Assessment   22 y.o. G2P1001 at 2165w0d by  08/05/2017, by Last Menstrual Period presenting for routine prenatal visit  Plan   Pregnancy #2 Problems (from 10/29/16 to  present)    Problem Noted Resolved   Supervision of other normal pregnancy, antepartum 12/26/2016 by Vena AustriaStaebler, Andreas, MD No   Overview Addendum 07/12/2017  9:53 AM by Vena AustriaStaebler, Andreas, MD    [facility] Prenatal Labs  Dating L=9 Blood type: O pos  Genetic Screen 1 Screen:    AFP:     Quad:     NIPS: Antibody:negative  Anatomic US Incomplete @ 20 wks [x]  repeat, complete at 22w Rubella: Non-immune  Varicella: Immune  GTT 122 RPR: NR  Rhogam N/A HBsAg: negative  TDaP vaccine 06/07/2017 Flu Shot: 05/17/2017 HIV: negative  Baby Food                                GBS: negative  Contraception  Pap: 12/24/16 NIL  CBB   Pelvis tested to 8lbs 13oz  CS/VBAC    Support Person                   Term labor symptoms and general obstetric precautions including but not limited to vaginal bleeding, contractions, leaking of fluid and fetal movement were reviewed in detail with the patient. Please refer to After Visit Summary for other counseling recommendations.   Return if symptoms worsen or fail to improve.  - IOL scheduled for 07/29/17.  Discussed option to not have IOL and await spontaneous labor. She would like to proceed with elective IOL.    Thomasene MohairStephen Kierstynn Babich, MD, Merlinda FrederickFACOG [facility], [facility] 07/29/2017 8:22 AM

## 2017-07-29 ENCOUNTER — Inpatient Hospital Stay
Admission: EM | Admit: 2017-07-29 | Discharge: 2017-07-31 | DRG: 807 | Disposition: A | Discharge status: Home / Self Care | Payer: 59 | Attending: Obstetrics and Gynecology | Admitting: Obstetrics and Gynecology

## 2017-07-29 ENCOUNTER — Inpatient Hospital Stay: Payer: 59 | Admitting: Anesthesiology

## 2017-07-29 ENCOUNTER — Encounter: Payer: Self-pay | Admitting: Obstetrics and Gynecology

## 2017-07-29 ENCOUNTER — Other Ambulatory Visit: Payer: Self-pay

## 2017-07-29 DIAGNOSIS — Z3480 Encounter for supervision of other normal pregnancy, unspecified trimester: Secondary | ICD-10-CM

## 2017-07-29 DIAGNOSIS — O3663X0 Maternal care for excessive fetal growth, third trimester, not applicable or unspecified: Principal | ICD-10-CM | POA: Diagnosis present

## 2017-07-29 DIAGNOSIS — Z3A39 39 weeks gestation of pregnancy: Secondary | ICD-10-CM

## 2017-07-29 LAB — CBC
HEMATOCRIT: 36.2 % (ref 35.0–47.0)
Hemoglobin: 12.2 g/dL (ref 12.0–16.0)
MCH: 30.2 pg (ref 26.0–34.0)
MCHC: 33.7 g/dL (ref 32.0–36.0)
MCV: 89.6 fL (ref 80.0–100.0)
PLATELETS: 212 10*3/uL (ref 150–440)
RBC: 4.04 MIL/uL (ref 3.80–5.20)
RDW: 12.8 % (ref 11.5–14.5)
WBC: 10 10*3/uL (ref 3.6–11.0)

## 2017-07-29 LAB — TYPE AND SCREEN
ABO/RH(D): O POS
ANTIBODY SCREEN: NEGATIVE

## 2017-07-29 MED ORDER — AMMONIA AROMATIC IN INHA
RESPIRATORY_TRACT | Status: AC
  Filled 2017-07-29: qty 10

## 2017-07-29 MED ORDER — LACTATED RINGERS IV SOLN: 500.0000 mL | Freq: Once | INTRAVENOUS | Status: DC

## 2017-07-29 MED ORDER — EPHEDRINE 5 MG/ML INJ
10.0000 mg | INTRAVENOUS | Status: DC | PRN
  Filled 2017-07-29: qty 2
  Filled 2017-07-29: qty 4

## 2017-07-29 MED ORDER — MISOPROSTOL 25 MCG QUARTER TABLET
ORAL_TABLET | ORAL | Status: AC
  Filled 2017-07-29: qty 1

## 2017-07-29 MED ORDER — LIDOCAINE HCL (PF) 1 % IJ SOLN
30.0000 mL | INTRAMUSCULAR | Status: AC | PRN
  Administered 2017-07-29: 3 mL via SUBCUTANEOUS

## 2017-07-29 MED ORDER — TERBUTALINE SULFATE 1 MG/ML IJ SOLN: 0.2500 mg | Freq: Once | INTRAMUSCULAR | Status: DC | PRN

## 2017-07-29 MED ORDER — LIDOCAINE HCL (PF) 2 % IJ SOLN
INTRAMUSCULAR | Status: DC | PRN
  Administered 2017-07-29: 3 mL via INTRADERMAL
  Administered 2017-07-29: 3 mL via EPIDURAL

## 2017-07-29 MED ORDER — FENTANYL 2.5 MCG/ML W/ROPIVACAINE 0.15% IN NS 100 ML EPIDURAL (ARMC)
12.0000 mL/h | EPIDURAL | Status: DC
  Administered 2017-07-29: 12 mL/h via EPIDURAL
  Filled 2017-07-29: qty 100

## 2017-07-29 MED ORDER — OXYTOCIN 40 UNITS IN LACTATED RINGERS INFUSION - SIMPLE MED
1.0000 m[IU]/min | INTRAVENOUS | Status: DC
  Administered 2017-07-29: 2 m[IU]/min via INTRAVENOUS

## 2017-07-29 MED ORDER — LACTATED RINGERS IV SOLN
INTRAVENOUS | Status: DC
  Administered 2017-07-29: 20:00:00 via INTRAVENOUS
  Administered 2017-07-29: 1000 mL via INTRAVENOUS
  Administered 2017-07-29: 22:00:00 via INTRAVENOUS
  Administered 2017-07-29: 1000 mL via INTRAVENOUS

## 2017-07-29 MED ORDER — MISOPROSTOL 200 MCG PO TABS
ORAL_TABLET | ORAL | Status: AC
  Filled 2017-07-29: qty 4

## 2017-07-29 MED ORDER — OXYTOCIN 10 UNIT/ML IJ SOLN
INTRAMUSCULAR | Status: AC
  Filled 2017-07-29: qty 2

## 2017-07-29 MED ORDER — OXYTOCIN 10 UNIT/ML IJ SOLN: 10.0000 [IU] | Freq: Once | INTRAMUSCULAR | Status: DC

## 2017-07-29 MED ORDER — MISOPROSTOL 25 MCG QUARTER TABLET
25.0000 ug | ORAL_TABLET | ORAL | Status: DC
  Administered 2017-07-29: 25 ug via VAGINAL

## 2017-07-29 MED ORDER — ONDANSETRON HCL 4 MG/2ML IJ SOLN
4.0000 mg | Freq: Four times a day (QID) | INTRAMUSCULAR | Status: DC | PRN
  Administered 2017-07-29: 4 mg via INTRAVENOUS
  Filled 2017-07-29: qty 2

## 2017-07-29 MED ORDER — MISOPROSTOL 25 MCG QUARTER TABLET
25.0000 ug | ORAL_TABLET | ORAL | Status: DC
  Administered 2017-07-29: 25 ug via ORAL
  Filled 2017-07-29: qty 1

## 2017-07-29 MED ORDER — PHENYLEPHRINE 40 MCG/ML (10ML) SYRINGE FOR IV PUSH (FOR BLOOD PRESSURE SUPPORT)
80.0000 ug | PREFILLED_SYRINGE | INTRAVENOUS | Status: DC | PRN
  Filled 2017-07-29: qty 5

## 2017-07-29 MED ORDER — SODIUM CHLORIDE 0.9 % IJ SOLN
INTRAMUSCULAR | Status: AC
  Filled 2017-07-29: qty 50

## 2017-07-29 MED ORDER — FENTANYL 2.5 MCG/ML W/ROPIVACAINE 0.15% IN NS 100 ML EPIDURAL (ARMC)
EPIDURAL | Status: AC
  Filled 2017-07-29: qty 100

## 2017-07-29 MED ORDER — EPHEDRINE 5 MG/ML INJ
10.0000 mg | INTRAVENOUS | Status: AC | PRN
  Administered 2017-07-29 (×2): 10 mg via INTRAVENOUS

## 2017-07-29 MED ORDER — OXYTOCIN 40 UNITS IN LACTATED RINGERS INFUSION - SIMPLE MED: 1.0000 m[IU]/min | INTRAVENOUS | Status: DC

## 2017-07-29 MED ORDER — BUPIVACAINE HCL (PF) 0.25 % IJ SOLN
INTRAMUSCULAR | Status: DC | PRN
  Administered 2017-07-29 (×2): 4 mL via EPIDURAL

## 2017-07-29 MED ORDER — SOD CITRATE-CITRIC ACID 500-334 MG/5ML PO SOLN: 30.0000 mL | ORAL | Status: DC | PRN

## 2017-07-29 MED ORDER — LIDOCAINE HCL (PF) 1 % IJ SOLN
INTRAMUSCULAR | Status: AC
  Filled 2017-07-29: qty 30

## 2017-07-29 MED ORDER — OXYTOCIN 40 UNITS IN LACTATED RINGERS INFUSION - SIMPLE MED
2.5000 [IU]/h | INTRAVENOUS | Status: DC
  Filled 2017-07-29: qty 1000

## 2017-07-29 MED ORDER — DIPHENHYDRAMINE HCL 50 MG/ML IJ SOLN: 12.5000 mg | INTRAMUSCULAR | Status: DC | PRN

## 2017-07-29 MED ORDER — LACTATED RINGERS IV SOLN: 500.0000 mL | INTRAVENOUS | Status: DC | PRN

## 2017-07-29 MED ORDER — OXYTOCIN BOLUS FROM INFUSION
500.0000 mL | Freq: Once | INTRAVENOUS | Status: AC
  Administered 2017-07-30: 500 mL via INTRAVENOUS

## 2017-07-29 NOTE — Progress Notes (Signed)
Labor and Delivery Note  S: Starting to feel contractions which are inconsistent in strength  O:BP 105/61 (BP Location: Right Arm)   Pulse 95   Temp 98 F (36.7 C) (Oral)   Resp 16   Wt 180 lb (81.6 kg)   LMP 10/29/2016   BMI 29.95 kg/m    General: in NAD, not having to breath through contractions  FHR: 125-130 baseline with accelerations to 160s to 170s, moderate variability Toco: contractions every 2-5 min apart with some coupling Cervix: Foley bulb sitting in vagina. Balloon deflated and foley removed. Cervix now 4cm/50%/-2/posterior  A: IOL in progress FWB: cat 1 tracing  P: Start Pitocin augmentation Epidural when appropriate  Lynn Rice, CNM

## 2017-07-29 NOTE — Progress Notes (Signed)
Patient ID: Marisa CyphersKelsey B Sanborn, female   DOB: 13-Dec-1995, 22 y.o.   MRN: [id number] Labor Check  Subj:  Complaints: occasional strong contractions, mostly feeling pressure with contractions   Obj:  BP 100/61   Pulse 88   Temp 98 F (36.7 C) (Oral)   Resp 16   Wt 180 lb (81.6 kg)   LMP 10/29/2016   BMI 29.95 kg/m  Dose (milli-units/min) Oxytocin: 4 milli-units/min  Cervix: Dilation: 4.5 / Effacement (%): 60 / Station: -2  Baseline FHR: 120 beats/min   Variability: moderate   Accelerations: present   Decelerations: absent Contractions: present frequency: 3-4 q 10 min Overall assessment: cat 1  A/P: 22 y.o. G2P1001 female at 4718w0d with elective IOL for suspected fetal macrosomia.  1.  Labor: continue pitocin per protocol, AROM after epidural  2.  FWB: reassuring, Overall assessment: category 1  3.  GBS neg  4.  Pain: epidural when ready/prn otherwise 5.  Recheck: once comfortable with epidural.   Thomasene MohairStephen Hermine Feria, MD, Merlinda FrederickFACOG [facility], [facility] 07/29/2017 7:13 PM

## 2017-07-29 NOTE — Progress Notes (Signed)
Patient ID: Lynn CyphersKelsey B Ryder, female   DOB: [birth year], 22 y.o.   MRN: [id number]  Labor Check  Subj:  Complaints: comfortable with epidural   Obj:  BP 101/63 (BP Location: Right Arm)   Pulse 83   Temp 97.6 F (36.4 C) (Oral)   Resp 16   Ht 5\' 5"  (1.651 m)   Wt 180 lb (81.6 kg)   LMP 10/29/2016   SpO2 100%   BMI 29.95 kg/m  Dose (milli-units/min) Oxytocin: 4 milli-units/min(verified at change of shift)  Cervix: Dilation: 4.5 / Effacement (%): 60 / Station: -2   AROM - clear fluid Baseline FHR: 120 beats/min   Variability: moderate   Accelerations: present   Decelerations: absent Contractions: present frequency: irregularly tracing, 2-3 q 10 min Overall assessment: cat 1  A/P: 22 y.o. G2P1001 female at 4471w0d with elective IOL.  1.  Labor: AROM continue pitocin per protocol  2.  FWB: reassuring, Overall assessment: category 1  3.  GBS neg  4.  Pain: epidural 5.  Recheck: q2 hours/prn   Thomasene MohairStephen Itzayanna Kaster, MD, Merlinda FrederickFACOG [facility], [facility] 07/29/2017 8:33 PM

## 2017-07-29 NOTE — H&P (Signed)
OB History & Physical   History of Present Illness:  Chief Complaint: induction of labor  HPI:  Lynn Rice is a 22 y.o. G48P1001 female at [redacted]w[redacted]d dated by LMP.  Her pregnancy has been uncomplicated.    She denies contractions.   She denies leakage of fluid.   She denies vaginal bleeding.   She reports fetal movement.    Maternal Medical History:  History reviewed. No pertinent past medical history.  Past Surgical History:  Procedure Laterality Date  . TONSILLECTOMY      No Known Allergies  Prior to Admission medications   Medication Sig Start Date End Date Taking? Authorizing Provider  magnesium 30 MG tablet Take 30 mg by mouth 2 (two) times daily.    [provider]    OB History  Gravida Para Term Preterm AB Living  2 1 1     1   SAB TAB Ectopic Multiple Live Births          1    # Outcome Date GA Lbr Len/2nd Weight Sex Delivery Anes PTL Lv  2 Current           1 Term 01/09/14   8 lb 13 oz (3.997 kg) M Vag-Spont  N LIV      Prenatal care site: [facility]  Social History: She  reports that  has never smoked. she has never used smokeless tobacco. She reports that she drinks alcohol. She reports that she does not use drugs.  Family History: family history includes Cervical cancer in her maternal grandmother; Heart failure in her paternal grandfather; Kidney disease in her paternal grandfather.   Review of Systems:  Review of Systems  Constitutional: Negative.   HENT: Negative.   Eyes: Negative.   Respiratory: Negative.   Cardiovascular: Negative.   Gastrointestinal: Negative.   Genitourinary: Negative.   Musculoskeletal: Negative.   Skin: Negative.   Neurological: Negative.   Psychiatric/Behavioral: Negative.      Physical Exam:  Vital Signs: LMP 10/29/2016  Constitutional: Well nourished, well developed female in no acute distress.  HEENT: normal Skin: Warm and dry.  Cardiovascular: Regular rate and rhythm.   Extremity: no edema    Respiratory: Clear to auscultation bilateral. Normal respiratory effort Abdomen: FHT present and gravid/NT Back: no CVAT Neuro: DTRs 2+, Cranial nerves grossly intact Psych: Alert and Oriented x3. No memory deficits. Normal mood and affect.  MS: normal gait, normal bilateral lower extremity ROM/strength/stability.  Pelvic exam: (female chaperone present) (as of 07/26/17) EGBUS: within normal limits Vagina: within normal limits and with normal mucosa blood in the vault Cervix: 1/30/-3   Pertinent Results:  Prenatal Labs: Blood type/Rh O positive  Antibody screen negative  Rubella Not immune  Varicella Immune    RPR NR  HBsAg negative  HIV negative  GC negative  Chlamydia negative  Genetic screening declined  1 hour GTT 122  3 hour GTT n/a  GBS negative on 07/10/17   Baseline FHR: 125 beats/min   Variability: moderate   Accelerations: present   Decelerations: absent Contractions: quiet Overall assessment: cat 1  Assessment:  Lynn Rice is a 46 y.o. G74P1001 female at [redacted]w[redacted]d with elective IOL for history of large infant. Desires IOL.   Plan:  1. Admit to Labor & Delivery  2. CBC, T&S, Clrs, IVF 3. GBS negative.   4. Fetwal well-being: reassuring 5. Patient has been explained the benefits/risk of IOL vs expectant management and elects IOL.  Will proceed with cervical ripening.  Thomasene MohairStephen Demont Linford, MD 07/29/2017 8:12 AM

## 2017-07-29 NOTE — Anesthesia Procedure Notes (Signed)
Epidural Patient location during procedure: OB  Staffing Performed: anesthesiologist   Preanesthetic Checklist Completed: patient identified, site marked, surgical consent, pre-op evaluation, timeout performed, IV checked, risks and benefits discussed and monitors and equipment checked  Epidural Patient position: sitting Prep: Betadine Patient monitoring: heart rate, continuous pulse ox and blood pressure Approach: midline Location: L4-L5 Injection technique: LOR saline  Needle:  Needle type: Tuohy  Needle gauge: 17 G Needle length: 9 cm and 9 Needle insertion depth: 6 cm Catheter type: closed end flexible Catheter size: 19 Gauge Catheter at skin depth: 12 cm Test dose: negative and 1.5% lidocaine with Epi 1:200 K  Assessment Sensory level: T10 Events: blood not aspirated, injection not painful, no injection resistance, negative IV test and no paresthesia  Additional Notes   Patient tolerated the insertion well without complications.-SATD -IVTD. No paresthesia. Refer to [facility] nursing for VS and dosingReason for block:procedure for pain     

## 2017-07-29 NOTE — Progress Notes (Signed)
Dr. Pernell DupreAdams notified of patient's BP's, 3 most recent read back.  Orders notified to infuse 250ml bolus LR at this time and continue to monitor BP's. If SBP <90 again, administer ephedrine as ordered.  Additional orders received to reduce epidural rate to 391mL/hr for 30 mins. if SBP remains <90 after ephedrine, then return to 4112mL/hr.

## 2017-07-29 NOTE — Anesthesia Preprocedure Evaluation (Signed)

## 2017-07-29 NOTE — Progress Notes (Signed)
Labor Check  Subj:  Complaints: comfortable with misoprostol.  Occasional contractions   Obj:  BP 105/61 (BP Location: Right Arm)   Pulse 95   Temp 98 F (36.7 C) (Oral)   Resp 16   Wt 180 lb (81.6 kg)   LMP 10/29/2016   BMI 29.95 kg/m     Cervix: Dilation: 1.5 / Effacement (%): 30 / Station: -3  Baseline FHR: 135 beats/min   Variability: moderate   Accelerations: present   Decelerations: absent Contractions: present frequency: infrequent Overall assessment: category 1  A/P: 22 y.o. G2P1001 female at 2029w0d with elective IOL due to possible fetal macrosomia.  1.  Labor: Foley balloon placed, 40 mL sterile saline injected into balloon. Patient tolerated well. Second dose of misoprostol placed (25 mcg)  2.  FWB: reassuring, Overall assessment: category 1  3.  GBS negative  4.  Pain: prn 5.  Recheck: 4 hours/prn    Thomasene MohairStephen Adelaide Pfefferkorn, MD, Merlinda FrederickFACOG [facility], [facility] 07/29/2017 12:46 PM   07/29/2017 12:46 PM

## 2017-07-29 NOTE — Progress Notes (Signed)
L&D Progress Note   22 [facility] presents at 39 weeks for elective IOL for history of macrosomic infant.  H&P reviewed  S: Some contractions over weekend. No vaginal bleeding or leakage of water. Baby moving well  O: 106/61  General: White gravid female in NAD  Abdomen: soft, NT, cephalic/ EFW 8#8oz FHR 125 ti 130 with accelerations to 170s, moderate variability, no decelerations Toco: one contraction in 30 min Cervix: closed internal os/40-50%/soft/-2/posterior  A: Elective IOL at 39 weeks for history of macrosomic infant FWB: Cat 1 tracing  P: Cytotec 25 mcg q4 hours Plan foley when possible DIscussed POM with patient including risks of hyperstimulation, serial induction, and FITL.  Patient verbalizes understanding and gives consent to proceed  Farrel Connersolleen Louie Meaders, CNM

## 2017-07-30 DIAGNOSIS — O3663X0 Maternal care for excessive fetal growth, third trimester, not applicable or unspecified: Secondary | ICD-10-CM

## 2017-07-30 DIAGNOSIS — Z3A39 39 weeks gestation of pregnancy: Secondary | ICD-10-CM

## 2017-07-30 LAB — CBC
HCT: 33.5 % — ABNORMAL LOW (ref 35.0–47.0)
Hemoglobin: 11.1 g/dL — ABNORMAL LOW (ref 12.0–16.0)
MCH: 29.7 pg (ref 26.0–34.0)
MCHC: 33 g/dL (ref 32.0–36.0)
MCV: 90.1 fL (ref 80.0–100.0)
PLATELETS: 178 10*3/uL (ref 150–440)
RBC: 3.72 MIL/uL — ABNORMAL LOW (ref 3.80–5.20)
RDW: 13.3 % (ref 11.5–14.5)
WBC: 16.5 10*3/uL — AB (ref 3.6–11.0)

## 2017-07-30 LAB — RPR: RPR Ser Ql: NONREACTIVE

## 2017-07-30 MED ORDER — PRENATAL MULTIVITAMIN CH
1.0000 | ORAL_TABLET | Freq: Every day | ORAL | Status: DC
  Administered 2017-07-30 – 2017-07-31 (×2): 1 via ORAL
  Filled 2017-07-30 (×2): qty 1

## 2017-07-30 MED ORDER — SENNOSIDES-DOCUSATE SODIUM 8.6-50 MG PO TABS
2.0000 | ORAL_TABLET | ORAL | Status: DC
  Administered 2017-07-31: 2 via ORAL
  Filled 2017-07-30: qty 2

## 2017-07-30 MED ORDER — FERROUS SULFATE 325 (65 FE) MG PO TABS
325.0000 mg | ORAL_TABLET | Freq: Two times a day (BID) | ORAL | Status: DC
  Administered 2017-07-30 – 2017-07-31 (×3): 325 mg via ORAL
  Filled 2017-07-30 (×3): qty 1

## 2017-07-30 MED ORDER — SIMETHICONE 80 MG PO CHEW: 80.0000 mg | CHEWABLE_TABLET | ORAL | Status: DC | PRN

## 2017-07-30 MED ORDER — MEASLES, MUMPS & RUBELLA VAC ~~LOC~~ INJ
0.5000 mL | INJECTION | SUBCUTANEOUS | Status: AC | PRN
  Administered 2017-07-31: 0.5 mL via SUBCUTANEOUS
  Filled 2017-07-30 (×2): qty 0.5

## 2017-07-30 MED ORDER — COCONUT OIL OIL
1.0000 "application " | TOPICAL_OIL | Status: DC | PRN
  Administered 2017-07-30: 1 via TOPICAL
  Filled 2017-07-30: qty 120

## 2017-07-30 MED ORDER — IBUPROFEN 600 MG PO TABS
600.0000 mg | ORAL_TABLET | Freq: Four times a day (QID) | ORAL | Status: DC
  Administered 2017-07-30 – 2017-07-31 (×5): 600 mg via ORAL
  Filled 2017-07-30 (×5): qty 1

## 2017-07-30 MED ORDER — HYDROCODONE-ACETAMINOPHEN 5-325 MG PO TABS
1.0000 | ORAL_TABLET | Freq: Four times a day (QID) | ORAL | Status: DC | PRN
  Administered 2017-07-30: 1 via ORAL
  Filled 2017-07-30: qty 1

## 2017-07-30 MED ORDER — ONDANSETRON HCL 4 MG/2ML IJ SOLN: 4.0000 mg | INTRAMUSCULAR | Status: DC | PRN

## 2017-07-30 MED ORDER — DIBUCAINE 1 % RE OINT
1.0000 | TOPICAL_OINTMENT | RECTAL | Status: DC | PRN
Start: 2017-07-30 — End: 2017-07-31

## 2017-07-30 MED ORDER — BENZOCAINE-MENTHOL 20-0.5 % EX AERO: 1.0000 "application " | INHALATION_SPRAY | CUTANEOUS | Status: DC | PRN

## 2017-07-30 MED ORDER — DIPHENHYDRAMINE HCL 25 MG PO CAPS: 25.0000 mg | ORAL_CAPSULE | Freq: Four times a day (QID) | ORAL | Status: DC | PRN

## 2017-07-30 MED ORDER — ACETAMINOPHEN 325 MG PO TABS: 650.0000 mg | ORAL_TABLET | ORAL | Status: DC | PRN

## 2017-07-30 MED ORDER — ONDANSETRON HCL 4 MG PO TABS: 4.0000 mg | ORAL_TABLET | ORAL | Status: DC | PRN

## 2017-07-30 MED ORDER — WITCH HAZEL-GLYCERIN EX PADS
1.0000 | MEDICATED_PAD | CUTANEOUS | Status: DC | PRN
Start: 2017-07-30 — End: 2017-07-31

## 2017-07-30 NOTE — Plan of Care (Signed)
Discussed plan of care with patient. Patient verbalized understanding.

## 2017-07-30 NOTE — Progress Notes (Signed)
Patient ID: Lynn Rice, female   DOB: 07-Jun-1995, 22 y.o.   MRN: [id number]  Labor Check  Subj:  Complaints: feeling more pressure   Obj:  BP (!) 94/48   Pulse 80   Temp (!) 97 F (36.1 C) (Axillary)   Resp 17   Ht 5\' 5"  (1.651 m)   Wt 180 lb (81.6 kg)   LMP 10/29/2016   SpO2 100%   BMI 29.95 kg/m  Dose (milli-units/min) Oxytocin: 8 milli-units/min  Cervix: Dilation: 5.5 / Effacement (%): 70 / Station: -1  Baseline FHR: 115 beats/min   Variability: moderate   Accelerations: present   Decelerations: absent Contractions: present frequency: not tracing well Overall assessment: cat 1  A/P: 22 y.o. G2P1001 female at 480w1d with elective IOL.  1.  Labor: making progress. Continue pitocin per protocol  2.  FWB: reassuring, Overall assessment: category 1  3.  GBS neg  4.  Pain: epidural 5.  Recheck: 2 hours/rn   Thomasene MohairStephen Marilouise Densmore, MD, Merlinda FrederickFACOG [facility], [facility] 07/30/2017 12:08 AM

## 2017-07-30 NOTE — Discharge Summary (Signed)
OB Discharge Summary     Patient Name: Lynn Rice DOB: [birth year] MRN: [id number]  Date of admission: 07/29/2017 Delivering MD: Thomasene Mohair, MD  Date of Delivery: 07/30/2017  Date of discharge: 07/31/2017  Admitting diagnosis: 39 weeks- induction Intrauterine pregnancy: [redacted]w[redacted]d     Secondary diagnosis: None     Discharge diagnosis: Term Pregnancy Delivered                                                                                                Post partum procedures: None  Augmentation: AROM and Pitocin  Complications: None  Hospital course:  Induction of Labor With Vaginal Delivery   22 y.o. yo G2P2002 at [redacted]w[redacted]d was admitted to the hospital 07/29/2017 for induction of labor.  Indication for induction: elective.  Patient had an uncomplicated labor course as follows: Membrane Rupture Time/Date: 8:26 PM ,07/29/2017   Intrapartum Procedures: Episiotomy: None [1]                                         Lacerations:  None [1]  Patient had delivery of a Viable infant.  Information for the patient's newborn:  Beretta, Ginsberg [[id number]]  Delivery Method: Vag-Spont   07/30/2017  Details of delivery can be found in separate delivery note.  Patient had a routine postpartum course. She is voiding and ambulating without difficulty and pain control is good with PRN medications. Patient is discharged home 07/31/17.  Physical exam  Vitals:   07/30/17 1636 07/30/17 2001 07/30/17 2343 07/31/17 0827  BP: 101/61 103/74 (!) 98/58 105/66  Pulse: 86 70 79 85  Resp: 18 18 18 20   Temp: 98 F (36.7 C) 98.1 F (36.7 C) 97.7 F (36.5 C) 97.6 F (36.4 C)  TempSrc: Oral Oral Oral Oral  SpO2: 99% 99% 99% 99%  Weight:      Height:       General: alert, cooperative and no distress Lochia: appropriate, rubra small Uterine Fundus: firm, U/1 Incision: N/A DVT Evaluation: No evidence of DVT seen on physical exam. No cords or calf tenderness. No significant calf/ankle  edema.  Labs: Lab Results  Component Value Date   WBC 16.5 (H) 07/30/2017   HGB 11.1 (L) 07/30/2017   HCT 33.5 (L) 07/30/2017   MCV 90.1 07/30/2017   PLT 178 07/30/2017    Discharge instruction: per After Visit Summary.  Medications:  Allergies as of 07/31/2017   No Known Allergies     Medication List    STOP taking these medications   magnesium 30 MG tablet       Diet: routine diet  Activity: Advance as tolerated. Pelvic rest for 6 weeks.   Outpatient follow up: Follow-up Information    Conard Novak, MD Follow up in 6 week(s).   Specialty:  Obstetrics and Gynecology Why:  6 week postpartum check Contact information: [address] [place] [address] [phone]             Postpartum contraception: Pills Rhogam Given postpartum: no  Rubella vaccine given postpartum: yes Varicella vaccine given postpartum: no TDaP given antepartum or postpartum: AP on 06/07/17 Influenza vaccine given antepartum or postpartum: AP on 05/17/2017  Newborn Data: Live born female  Birth Weight: 7 lb 9 oz (3430 g) APGAR: 8, 9  Newborn Delivery   Birth date/time:  07/30/2017 04:08:00 Delivery type:  Vaginal, Spontaneous    Baby Feeding: Breast  Disposition: home with mother  SIGNED: Marcelyn BruinsJacelyn Taysom Glymph, CNM 07/31/2017  9:19 AM

## 2017-07-31 DIAGNOSIS — O3663X0 Maternal care for excessive fetal growth, third trimester, not applicable or unspecified: Secondary | ICD-10-CM | POA: Diagnosis not present

## 2017-07-31 DIAGNOSIS — Z3A39 39 weeks gestation of pregnancy: Secondary | ICD-10-CM | POA: Diagnosis not present

## 2017-07-31 NOTE — Anesthesia Postprocedure Evaluation (Signed)
Anesthesia Post Note  Patient: Lynn Rice  Procedure(s) Performed: AN AD HOC LABOR EPIDURAL  Patient location during evaluation: Mother Baby Anesthesia Type: Epidural Level of consciousness: awake and alert and oriented Pain management: satisfactory to patient Vital Signs Assessment: post-procedure vital signs reviewed and stable Respiratory status: respiratory function stable Cardiovascular status: stable Postop Assessment: no headache, epidural receding, no backache, adequate PO intake, no apparent nausea or vomiting and patient able to bend at knees Anesthetic complications: no     Last Vitals:  Vitals:   07/30/17 2001 07/30/17 2343  BP: 103/74 (!) 98/58  Pulse: 70 79  Resp: 18 18  Temp: 36.7 C 36.5 C  SpO2: 99% 99%    Last Pain:  Vitals:   07/31/17 0459  TempSrc:   PainSc: Thomasene RippleAsleep                 Melizza Kanode D

## 2017-07-31 NOTE — Progress Notes (Signed)
Discharged to home via auxillary 

## 2017-07-31 NOTE — Progress Notes (Signed)
Discharge instructions reviewed with pt.  Verb u/o. 

## 2017-08-12 ENCOUNTER — Telehealth: Payer: Self-pay | Admitting: Obstetrics and Gynecology

## 2017-08-12 ENCOUNTER — Ambulatory Visit: Payer: Self-pay | Admitting: Family Medicine

## 2017-08-12 ENCOUNTER — Other Ambulatory Visit: Payer: Self-pay | Admitting: Obstetrics and Gynecology

## 2017-08-12 VITALS — BP 96/56 | HR 87 | Temp 98.0°F | Wt 161.0 lb

## 2017-08-12 DIAGNOSIS — R300 Dysuria: Secondary | ICD-10-CM

## 2017-08-12 DIAGNOSIS — O8622 Infection of bladder following delivery: Secondary | ICD-10-CM

## 2017-08-12 DIAGNOSIS — N3000 Acute cystitis without hematuria: Secondary | ICD-10-CM

## 2017-08-12 LAB — POCT URINALYSIS DIPSTICK
Bilirubin, UA: NEGATIVE
Glucose, UA: NEGATIVE
Ketones, UA: NEGATIVE
NITRITE UA: POSITIVE
PH UA: 7.5 (ref 5.0–8.0)
Spec Grav, UA: 1.015 (ref 1.010–1.025)
UROBILINOGEN UA: 0.2 U/dL

## 2017-08-12 MED ORDER — CEPHALEXIN 500 MG PO CAPS: 500.0000 mg | ORAL_CAPSULE | Freq: Two times a day (BID) | ORAL | 0 refills | Status: AC

## 2017-08-12 NOTE — Progress Notes (Signed)
Per patient painful urination since Friday evening has been using generic AZO.

## 2017-08-12 NOTE — Telephone Encounter (Signed)
Patient was seen at the [facility] and had positive UA.  They wanted her to follow up with her OBGYN since they didn't feel comfortable prescribing medication as she is still breastfeeding.   If pt could get prescription sent in to pharmacy without having to be seen again? She is scheduled with [facility] if needed in the morning. If appt not needed let me know and I will cancel.  [facility].

## 2017-08-12 NOTE — Progress Notes (Signed)
Lynn Rice is a 22 y.o. female who is 2 weeks post partum who presents today with complaints of urinary symptoms of burning, urgency and frequency. She reports taking OTC azo for symptom relief. She reports a normal vaginal delivery and she is breast feeding. She denies any fever, chills or back pain today.  Review of Systems  Constitutional: Negative for chills, fever and malaise/fatigue.  HENT: Negative.   Eyes: Negative.   Respiratory: Negative.   Cardiovascular: Negative.   Gastrointestinal: Negative.   Genitourinary: Positive for dysuria, frequency and urgency. Negative for flank pain and hematuria.  Musculoskeletal: Negative.   Skin: Negative.   Neurological: Negative.   Endo/Heme/Allergies: Negative.   Psychiatric/Behavioral: Negative.     O: Vitals:   08/12/17 1128  BP: (!) 96/56  Pulse: 87  Temp: 98 F (36.7 C)  SpO2: 96%   Physical Exam  Constitutional: She is oriented to person, place, and time. She appears well-developed.  HENT:  Head: Normocephalic.  Neck: Normal range of motion.  Cardiovascular: Normal rate.  Pulmonary/Chest: Effort normal and breath sounds normal.  Abdominal: Soft. Bowel sounds are normal.  No flank pain on palpation- abdomen soft- no guarding or wincing when palpated.  Musculoskeletal: Normal range of motion.  Neurological: She is alert and oriented to person, place, and time.  Skin: Skin is warm and dry.  Vitals reviewed.   A:  1. Dysuria   2. Postpartum acute cystitis     P: Reviewed treatment options via Uptodate and concern with medication options and safety due to patient recent hospital discharge which increases risk for multidrug resistant organisms- restricting medication selection to certain drugs. [facility] no safe antibiotic choice identified without causing harm to breastfeeding mother/neonate. Concern that urine culture may be best diagnostic tool to direct treatment, and this option is  beyond the scope of this healthcare facility [facility]. Patient is still under the care of an OBGYN familiar with this patient until 6 week f/u- Next day appointment made for patient at her OB office for further eval and treatment, [facility] appt. [id number]- 3/19 AM. Patient aware and agrees with plan of care.  1. Postpartum acute cystitis Follow up with OB provider for eval and treat. 2. Dysuria - POCT Urinalysis Dipstick-  +leu +nitrates

## 2017-08-12 NOTE — Telephone Encounter (Signed)
RX being sent in by SDJ. Per Raynelle FanningJulie she will let pt know.

## 2017-08-13 ENCOUNTER — Ambulatory Visit: Payer: 59 | Admitting: Obstetrics and Gynecology

## 2017-08-13 NOTE — Patient Instructions (Signed)

## 2017-09-11 ENCOUNTER — Ambulatory Visit (INDEPENDENT_AMBULATORY_CARE_PROVIDER_SITE_OTHER): Payer: 59 | Admitting: Obstetrics and Gynecology

## 2017-09-11 ENCOUNTER — Encounter: Payer: Self-pay | Admitting: Obstetrics and Gynecology

## 2017-09-11 MED ORDER — NORETHINDRONE 0.35 MG PO TABS
1.0000 | ORAL_TABLET | Freq: Every day | ORAL | 11 refills | Status: DC
Start: 2017-09-11 — End: 2018-08-14

## 2017-09-11 NOTE — Progress Notes (Signed)
Postpartum Visit  Chief Complaint:  Chief Complaint  Patient presents with  . 6 week post partum    History of Present Illness: Patient is a 22 y.o. [id number] presents for postpartum visit.  Date of delivery: 07/30/2017 Type of delivery: Vaginal delivery - Vacuum or forceps assisted  no Episiotomy No.  Laceration: no Pregnancy or labor problems:  no Any problems since the delivery:  no  Newborn Details:  SINGLETON :  1. Baby's name: Pamelia HoitLevi. Birth weight: 7.9 Maternal Details:  Breast Feeding:   Post partum depression/anxiety noted:  no Edinburgh Post-Partum Depression Score:  1  Date of last PAP: 12/24/2016/NORMAL  History reviewed. No pertinent past medical history.  Past Surgical History:  Procedure Laterality Date  . TONSILLECTOMY      Prior to Admission medications   Not on File    No Known Allergies   Social History   Socioeconomic History  . Marital status: Single    Spouse name: Not on file  . Number of children: Not on file  . Years of education: Not on file  . Highest education level: Not on file  Occupational History  . Not on file  Social Needs  . Financial resource strain: Not on file  . Food insecurity:    Worry: Not on file    Inability: Not on file  . Transportation needs:    Medical: Not on file    Non-medical: Not on file  Tobacco Use  . Smoking status: Never Smoker  . Smokeless tobacco: Never Used  Substance and Sexual Activity  . Alcohol use: Yes  . Drug use: No  . Sexual activity: Yes    Partners: Male    Birth control/protection: Pill  Lifestyle  . Physical activity:    Days per week: Not on file    Minutes per session: Not on file  . Stress: Not on file  Relationships  . Social connections:    Talks on phone: Not on file    Gets together: Not on file    Attends religious service: Not on file    Active member of club or organization: Not on file    Attends meetings of clubs or organizations: Not on file    Relationship  status: Not on file  . Intimate partner violence:    Fear of current or ex partner: Not on file    Emotionally abused: Not on file    Physically abused: Not on file    Forced sexual activity: Not on file  Other Topics Concern  . Not on file  Social History Narrative  . Not on file    Family History  Problem Relation Age of Onset  . Kidney disease Paternal Grandfather   . Heart failure Paternal Grandfather   . Cervical cancer Maternal Grandmother     Review of Systems  Constitutional: Negative.   HENT: Negative.   Eyes: Negative.   Respiratory: Negative.   Cardiovascular: Negative.   Gastrointestinal: Negative.   Genitourinary: Negative.   Musculoskeletal: Negative.   Skin: Negative.   Neurological: Negative.   Psychiatric/Behavioral: Negative.      Physical Exam BP 122/70   Ht 5\' 5"  (1.651 m)   Wt 158 lb (71.7 kg)   LMP 10/29/2016   BMI 26.29 kg/m   Physical Exam  Constitutional: She is oriented to person, place, and time. She appears well-developed and well-nourished. No distress.  Genitourinary: Vagina normal and uterus normal. Pelvic exam was performed with patient supine. There is no  rash, tenderness or lesion on the right labia. There is no rash, tenderness or lesion on the left labia. Right adnexum does not display mass, does not display tenderness and does not display fullness. Left adnexum does not display mass, does not display tenderness and does not display fullness. Cervix does not exhibit motion tenderness, lesion or polyp.   Uterus is mobile. Uterus is not enlarged, tender, exhibiting a mass or irregular (is regular).  Eyes: EOM are normal. No scleral icterus.  Neck: Normal range of motion. Neck supple.  Cardiovascular: Normal rate and regular rhythm.  Pulmonary/Chest: Effort normal and breath sounds normal. No respiratory distress. She has no wheezes. She has no rales.  Abdominal: Soft. Bowel sounds are normal. She exhibits no distension and no mass.  There is no tenderness. There is no rebound and no guarding.  Musculoskeletal: Normal range of motion. She exhibits no edema.  Neurological: She is alert and oriented to person, place, and time. No cranial nerve deficit.  Skin: Skin is warm and dry. No erythema.  Psychiatric: She has a normal mood and affect. Her behavior is normal. Judgment normal.     Female Chaperone present during breast and/or pelvic exam.  Assessment: 22 y.o. [id number] presenting for 6 week postpartum visit  Plan: Problem List Items Addressed This Visit    None    Visit Diagnoses    Postpartum care and examination    -  Primary   Relevant Medications   norethindrone (MICRONOR,CAMILA,ERRIN) 0.35 MG tablet       1) Contraception Education given regarding options for contraception, including oral contraceptives.  2)  Pap - ASCCP guidelines and rational discussed.  Patient opts for routine screening interval  3) Patient underwent screening for postpartum depression with no concerns noted.  4) Follow up 1 year for routine annual exam  Thomasene Mohair, MD 09/11/2017 4:49 PM

## 2017-10-28 ENCOUNTER — Telehealth: Payer: Self-pay

## 2017-10-28 ENCOUNTER — Ambulatory Visit (INDEPENDENT_AMBULATORY_CARE_PROVIDER_SITE_OTHER): Payer: 59 | Admitting: Obstetrics and Gynecology

## 2017-10-28 DIAGNOSIS — R300 Dysuria: Secondary | ICD-10-CM

## 2017-10-28 LAB — POCT WET PREP WITH KOH
CLUE CELLS WET PREP PER HPF POC: NEGATIVE
KOH Prep POC: NEGATIVE
PH, VAGINAL: 4.5
TRICHOMONAS UA: NEGATIVE

## 2017-10-28 LAB — POCT URINALYSIS DIPSTICK
Bilirubin, UA: NEGATIVE
Blood, UA: NEGATIVE
Glucose, UA: NEGATIVE
Ketones, UA: NEGATIVE
LEUKOCYTES UA: NEGATIVE
NITRITE UA: NEGATIVE
ODOR: NORMAL
PH UA: 5 (ref 5.0–8.0)
PROTEIN UA: NEGATIVE
Spec Grav, UA: >=1.03 — AB (ref 1.010–1.025)
UROBILINOGEN UA: 0.2 U/dL

## 2017-10-28 NOTE — Telephone Encounter (Signed)
Per SDJ Pt can stop by & give a urine specimen. If not + for UA, he will examine pt. Pt advised to contact us to let us know when she can come by. SDJ here today thru wed.

## 2017-10-28 NOTE — Telephone Encounter (Signed)
Spoke w/pt. She has had burning with urination. Feels like she is unable to completely empty her bladder.

## 2017-10-28 NOTE — Progress Notes (Signed)
Obstetrics & Gynecology Office Visit   Chief Complaint  Patient presents with  . Urinary Tract Infection    Dysuria x 1 day    History of Present Illness: 22 y.o. G89P2002 female who presents with acute-onset dysuria.  She denies notes burning just inside her vagina. Sometimes the burning was present to a severe extent, especially as she nears completion of voiding.  She denies vaginal discharge, itching, or irritation. She denies any new exposures.  Her urine does not have an abnormal odor. Denies fevers, chills, hematuria.    Past Medical History: denies  Past Surgical History:  Procedure Laterality Date  . TONSILLECTOMY     Gynecologic History: No LMP recorded.  Obstetric History: [id number]  Family History  Problem Relation Age of Onset  . Kidney disease Paternal Grandfather   . Heart failure Paternal Grandfather   . Cervical cancer Maternal Grandmother     Social History   Socioeconomic History  . Marital status: Single    Spouse name: Not on file  . Number of children: Not on file  . Years of education: Not on file  . Highest education level: Not on file  Occupational History  . Not on file  Social Needs  . Financial resource strain: Not on file  . Food insecurity:    Worry: Not on file    Inability: Not on file  . Transportation needs:    Medical: Not on file    Non-medical: Not on file  Tobacco Use  . Smoking status: Never Smoker  . Smokeless tobacco: Never Used  Substance and Sexual Activity  . Alcohol use: Yes  . Drug use: No  . Sexual activity: Yes    Partners: Male    Birth control/protection: Pill  Lifestyle  . Physical activity:    Days per week: Not on file    Minutes per session: Not on file  . Stress: Not on file  Relationships  . Social connections:    Talks on phone: Not on file    Gets together: Not on file    Attends religious service: Not on file    Active member of club or organization: Not on file    Attends meetings of clubs or  organizations: Not on file    Relationship status: Not on file  . Intimate partner violence:    Fear of current or ex partner: Not on file    Emotionally abused: Not on file    Physically abused: Not on file    Forced sexual activity: Not on file  Other Topics Concern  . Not on file  Social History Narrative  . Not on file    No Known Allergies  Prior to Admission medications   Medication Sig Start Date End Date Taking? Authorizing Provider  norethindrone (MICRONOR,CAMILA,ERRIN) 0.35 MG tablet Take 1 tablet (0.35 mg total) by mouth daily. 09/11/17   Conard Novak, MD    Review of Systems  Constitutional: Negative.   HENT: Negative.   Eyes: Negative.   Respiratory: Negative.   Cardiovascular: Negative.   Gastrointestinal: Negative.   Genitourinary: Positive for dysuria. Negative for flank pain, frequency, hematuria and urgency.  Musculoskeletal: Negative.   Skin: Negative.   Neurological: Negative.   Psychiatric/Behavioral: Negative.      Physical Exam There were no vitals taken for this visit. No LMP recorded. Physical Exam  Constitutional: She is oriented to person, place, and time. She appears well-developed and well-nourished. She appears distressed.  Genitourinary: Vagina normal and  uterus normal. Pelvic exam was performed with patient supine. There is no rash, tenderness or lesion on the right labia. There is no rash, tenderness or lesion on the left labia. Right adnexum does not display mass, does not display tenderness and does not display fullness. Left adnexum does not display mass, does not display tenderness and does not display fullness. Cervix does not exhibit motion tenderness, lesion or polyp.   Uterus is mobile. Uterus is not enlarged, tender, exhibiting a mass or irregular (is regular).  Genitourinary Comments: No periurethral lesions  HENT:  Head: Normocephalic and atraumatic.  Eyes: Conjunctivae are normal. No scleral icterus.  Pulmonary/Chest: No  respiratory distress.  Abdominal: Soft. She exhibits no distension and no mass. There is no tenderness. There is no rebound and no guarding.  Musculoskeletal: Normal range of motion. She exhibits no edema.  Neurological: She is alert and oriented to person, place, and time. No cranial nerve deficit.  Psychiatric: She has a normal mood and affect. Her behavior is normal. Judgment normal.   Female chaperone present for pelvic and breast  portions of the physical exam  Wet Prep: PH: 4.5 Clue Cells: Negative Fungal elements: Negative Trichomonas: Negative   Assessment: 22 y.o. 562P2002 female here for  1. Dysuria      Plan: Problem List Items Addressed This Visit    None    Visit Diagnoses    Dysuria    -  Primary   Relevant Orders   POCT Urinalysis Dipstick (Completed)   Urine Culture   POCT Wet Prep with KOH (Completed)     No obvious source. Will send for urine culture.  Thomasene MohairStephen Veronique Warga, MD 10/28/2017 5:34 PM

## 2017-10-28 NOTE — Telephone Encounter (Signed)
Pt aware & will be here by 430

## 2017-10-28 NOTE — Telephone Encounter (Signed)
Pt states she is 3 months post partum & has a UTI. She had one when she was 2 wks pp & [facility] had sent something in. Pt inquiring if she can get a rx or if she needs an appointment? Cb#[phone]

## 2017-10-29 DIAGNOSIS — R300 Dysuria: Secondary | ICD-10-CM | POA: Diagnosis not present

## 2017-10-31 ENCOUNTER — Other Ambulatory Visit: Payer: Self-pay | Admitting: Obstetrics and Gynecology

## 2017-10-31 DIAGNOSIS — N3000 Acute cystitis without hematuria: Secondary | ICD-10-CM

## 2017-10-31 LAB — URINE CULTURE

## 2017-10-31 MED ORDER — SULFAMETHOXAZOLE-TRIMETHOPRIM 800-160 MG PO TABS: 1.0000 | ORAL_TABLET | Freq: Two times a day (BID) | ORAL | 0 refills | Status: AC

## 2018-08-14 ENCOUNTER — Other Ambulatory Visit: Payer: Self-pay | Admitting: Obstetrics and Gynecology

## 2018-08-14 DIAGNOSIS — Z3041 Encounter for surveillance of contraceptive pills: Secondary | ICD-10-CM

## 2018-08-14 MED ORDER — NORETHIN ACE-ETH ESTRAD-FE 1-20 MG-MCG PO TABS: 1.0000 | ORAL_TABLET | Freq: Every day | ORAL | 4 refills | Status: DC

## 2018-08-14 NOTE — Telephone Encounter (Signed)
Tell her to hold off on appt.  I will call in birth control. Find out what she used before. I'll send that in. If she wants to talk, I'll call her to discuss. Thank you!

## 2018-10-24 ENCOUNTER — Ambulatory Visit (INDEPENDENT_AMBULATORY_CARE_PROVIDER_SITE_OTHER): Payer: Self-pay | Admitting: Maternal Newborn

## 2018-10-24 ENCOUNTER — Other Ambulatory Visit: Payer: Self-pay

## 2018-10-24 ENCOUNTER — Encounter: Payer: Self-pay | Admitting: Maternal Newborn

## 2018-10-24 VITALS — BP 98/70 | Ht 65.0 in | Wt 145.0 lb

## 2018-10-24 DIAGNOSIS — R399 Unspecified symptoms and signs involving the genitourinary system: Secondary | ICD-10-CM

## 2018-10-24 LAB — POCT URINALYSIS DIPSTICK

## 2018-10-24 MED ORDER — NITROFURANTOIN MONOHYD MACRO 100 MG PO CAPS: 100.0000 mg | ORAL_CAPSULE | Freq: Two times a day (BID) | ORAL | 0 refills | Status: DC

## 2018-10-24 NOTE — Progress Notes (Signed)
Obstetrics & Gynecology Office Visit   Chief Complaint:  Chief Complaint  Patient presents with  . Urinary Tract Infection    painful urinating, frequency urinating, no lower back or pelvic pain, no blood since early this morning, started AZO     History of Present Illness: Lynn Rice woke up very early this morning with UTI symptoms of dysuria and urinary frequency. She took Azo before coming to her appointment, which did not relieve her symptoms. She is going out of town today and is concerned about having symptoms untreated.  Review of Systems: Review of systems negative unless otherwise noted in HPI.  Past Medical History:  History reviewed. No pertinent past medical history.  Past Surgical History:  Past Surgical History:  Procedure Laterality Date  . TONSILLECTOMY      Gynecologic History: Patient's last menstrual period was 10/14/2018 (exact date).  Obstetric History: [id number]  Family History:  Family History  Problem Relation Age of Onset  . Kidney disease Paternal Grandfather   . Heart failure Paternal Grandfather   . Cervical cancer Maternal Grandmother     Social History:  Social History   Socioeconomic History  . Marital status: Single    Spouse name: Not on file  . Number of children: Not on file  . Years of education: Not on file  . Highest education level: Not on file  Occupational History  . Not on file  Social Needs  . Financial resource strain: Not on file  . Food insecurity:    Worry: Not on file    Inability: Not on file  . Transportation needs:    Medical: Not on file    Non-medical: Not on file  Tobacco Use  . Smoking status: Never Smoker  . Smokeless tobacco: Never Used  Substance and Sexual Activity  . Alcohol use: Yes  . Drug use: No  . Sexual activity: Yes    Partners: Male    Birth control/protection: Pill  Lifestyle  . Physical activity:    Days per week: Not on file    Minutes per session: Not on file  . Stress: Not on  file  Relationships  . Social connections:    Talks on phone: Not on file    Gets together: Not on file    Attends religious service: Not on file    Active member of club or organization: Not on file    Attends meetings of clubs or organizations: Not on file    Relationship status: Not on file  . Intimate partner violence:    Fear of current or ex partner: Not on file    Emotionally abused: Not on file    Physically abused: Not on file    Forced sexual activity: Not on file  Other Topics Concern  . Not on file  Social History Narrative  . Not on file    Allergies:  No Known Allergies  Medications: Prior to Admission medications   Medication Sig Start Date End Date Taking? Authorizing Provider  norethindrone-ethinyl estradiol (JUNEL FE 1/20) 1-20 MG-MCG tablet Take 1 tablet by mouth daily. 08/14/18   Conard Novak, MD    Physical Exam Vitals:  Vitals:   10/24/18 0811  BP: 98/70   Patient's last menstrual period was 10/14/2018 (exact date).  General: NAD HEENT: normocephalic, anicteric Pulmonary: No increased work of breathing Neurologic: Grossly intact Psychiatric: mood appropriate, affect full  Assessment: 23 y.o. [id number] with UTI symptoms and blood in urine on UA.  Plan:  Problem List Items Addressed This Visit    None    Visit Diagnoses    UTI symptoms    -  Primary   Relevant Medications   nitrofurantoin, macrocrystal-monohydrate, (MACROBID) 100 MG capsule   Other Relevant Orders   Urine Culture   POCT Urinalysis Dipstick (Completed)     Discussed empiric treatment versus watchful waiting for culture results. With blood in urine on UA, incomplete relief with Azo, and planned out of town travel, she would like to begin medication now. She understands that she may need to be on a different medication when results arrive.  Rx sent to pharmacy.  Marcelyn BruinsJacelyn Elfrieda Espino, CNM 10/24/2018  3:48 PM

## 2018-10-26 LAB — URINE CULTURE

## 2019-04-28 ENCOUNTER — Ambulatory Visit (INDEPENDENT_AMBULATORY_CARE_PROVIDER_SITE_OTHER): Payer: Self-pay | Admitting: Family Medicine

## 2019-04-28 ENCOUNTER — Other Ambulatory Visit: Payer: Self-pay

## 2019-04-28 DIAGNOSIS — L259 Unspecified contact dermatitis, unspecified cause: Secondary | ICD-10-CM

## 2019-04-28 MED ORDER — PREDNISONE 20 MG PO TABS: ORAL_TABLET | ORAL | 0 refills | Status: DC

## 2019-04-28 NOTE — Progress Notes (Signed)
   Subjective:    Patient ID: Lynn Rice, female    DOB: 08-Nov-1995, 23 y.o.   MRN: [id number]  HPI Pt is having poison oak or poison ive on face on neck. Noticed on Thursday and has become worse. Pt states it is very itchy. Pt has been applying poison ivy itch cream and Cortisone maximum strength. Helps a little bit.   Virtual Visit via Telephone Note  I connected with Lynn Rice on 04/28/19 at  9:30 AM EST by telephone and verified that I am speaking with the correct person using two identifiers.  Location: Patient: home Provider: office   I discussed the limitations, risks, security and privacy concerns of performing an evaluation and management service by telephone and the availability of in person appointments. I also discussed with the patient that there may be a patient responsible charge related to this service. The patient expressed understanding and agreed to proceed.   History of Present Illness:    Observations/Objective:   Assessment and Plan:   Follow Up Instructions:    I discussed the assessment and treatment plan with the patient. The patient was provided an opportunity to ask questions and all were answered. The patient agreed with the plan and demonstrated an understanding of the instructions.   The patient was advised to call back or seek an in-person evaluation if the symptoms worsen or if the condition fails to improve as anticipated.  I provided 12 minutes of non-face-to-face time during this encounter.   Vicente Males, LPN    Review of Systems  Constitutional: Negative for activity change and appetite change.  HENT: Negative for congestion and rhinorrhea.   Respiratory: Negative for cough and shortness of breath.   Cardiovascular: Negative for chest pain and leg swelling.  Gastrointestinal: Negative for abdominal pain, nausea and vomiting.  Skin: Negative for color change.  Neurological: Negative for dizziness and weakness.   Psychiatric/Behavioral: Negative for agitation and confusion.       Objective:   Physical Exam  Patient with erythematous rash on the neck and the face consistent with contact dermatitis      Assessment & Plan:  Contact dermatitis recommend going ahead with prednisone taper over the next 9 days warning signs discussed follow-up if problems

## 2019-05-18 ENCOUNTER — Ambulatory Visit: Payer: Medicaid Other | Attending: Internal Medicine

## 2019-05-18 ENCOUNTER — Other Ambulatory Visit: Payer: Self-pay

## 2019-05-18 DIAGNOSIS — Z20828 Contact with and (suspected) exposure to other viral communicable diseases: Secondary | ICD-10-CM | POA: Diagnosis not present

## 2019-05-18 DIAGNOSIS — Z20822 Contact with and (suspected) exposure to covid-19: Secondary | ICD-10-CM

## 2019-05-19 LAB — NOVEL CORONAVIRUS, NAA: SARS-CoV-2, NAA: NOT DETECTED

## 2019-07-23 ENCOUNTER — Ambulatory Visit (INDEPENDENT_AMBULATORY_CARE_PROVIDER_SITE_OTHER): Payer: Self-pay | Admitting: Family Medicine

## 2019-07-23 ENCOUNTER — Other Ambulatory Visit: Payer: Self-pay

## 2019-07-23 ENCOUNTER — Encounter: Payer: Self-pay | Admitting: Family Medicine

## 2019-07-23 VITALS — Temp 97.6°F | Wt 141.6 lb

## 2019-07-23 DIAGNOSIS — R21 Rash and other nonspecific skin eruption: Secondary | ICD-10-CM

## 2019-07-23 MED ORDER — CLINDAMYCIN PHOS-BENZOYL PEROX 1-5 % EX GEL: Freq: Two times a day (BID) | CUTANEOUS | 2 refills | Status: DC

## 2019-07-23 NOTE — Progress Notes (Signed)
   Subjective:    Patient ID: Lynn Rice, female    DOB: Dec 13, 1995, 24 y.o.   MRN: [id number]  Rash This is a new problem. The current episode started 1 to 4 weeks ago. The affected locations include the face (mostly nose and mouth). The rash is characterized by burning and itchiness (not painful until recently ). She was exposed to nothing. Treatments tried: Cortisone cream, equate eczema lotion  The treatment provided no relief.   Relates from some rash on the face.   Review of Systems  Skin: Positive for rash.       Objective:   Physical Exam Papular rash on the face Hard to tell if this is a form of acne or possibly atopic dermatitis      Assessment & Plan:  Hold off on steroid cream Try BenzaClin solution twice daily If not dramatically better within 2 weeks referral to dermatology

## 2019-09-10 ENCOUNTER — Telehealth: Payer: Self-pay

## 2019-09-10 NOTE — Telephone Encounter (Signed)
Pt calling triage c/o pain with intercourse pt  Scheduled with ABC on Tuesday

## 2019-09-15 ENCOUNTER — Ambulatory Visit: Payer: Medicaid Other | Admitting: Obstetrics and Gynecology

## 2019-10-12 ENCOUNTER — Telehealth: Payer: Self-pay

## 2019-10-12 DIAGNOSIS — Z3041 Encounter for surveillance of contraceptive pills: Secondary | ICD-10-CM

## 2019-10-12 MED ORDER — NORETHIN ACE-ETH ESTRAD-FE 1-20 MG-MCG PO TABS: 1.0000 | ORAL_TABLET | Freq: Every day | ORAL | 0 refills | Status: DC

## 2019-10-12 NOTE — Telephone Encounter (Signed)
Patient inquiring if apt is needed for OCP refill. She was unable to request thru my chart. Cb#[phone]

## 2019-10-12 NOTE — Telephone Encounter (Signed)
Patient is schedule for 10/21/19 at 2:30 with [facility]. Patient would like prescription sent to get to her appointment. Please advise

## 2019-10-21 ENCOUNTER — Ambulatory Visit (INDEPENDENT_AMBULATORY_CARE_PROVIDER_SITE_OTHER): Payer: Medicaid Other | Admitting: Obstetrics and Gynecology

## 2019-10-21 ENCOUNTER — Other Ambulatory Visit (HOSPITAL_COMMUNITY)
Admission: RE | Admit: 2019-10-21 | Discharge: 2019-10-21 | Disposition: A | Discharge status: Home / Self Care | Payer: Medicaid Other | Source: Ambulatory Visit | Attending: Obstetrics and Gynecology | Admitting: Obstetrics and Gynecology

## 2019-10-21 ENCOUNTER — Encounter: Payer: Self-pay | Admitting: Obstetrics and Gynecology

## 2019-10-21 VITALS — BP 122/70 | Wt 135.0 lb

## 2019-10-21 DIAGNOSIS — Z124 Encounter for screening for malignant neoplasm of cervix: Secondary | ICD-10-CM | POA: Diagnosis present

## 2019-10-21 DIAGNOSIS — Z01419 Encounter for gynecological examination (general) (routine) without abnormal findings: Secondary | ICD-10-CM

## 2019-10-21 DIAGNOSIS — Z1331 Encounter for screening for depression: Secondary | ICD-10-CM

## 2019-10-21 DIAGNOSIS — Z1339 Encounter for screening examination for other mental health and behavioral disorders: Secondary | ICD-10-CM

## 2019-10-21 DIAGNOSIS — Z3041 Encounter for surveillance of contraceptive pills: Secondary | ICD-10-CM

## 2019-10-21 MED ORDER — NORETHIN ACE-ETH ESTRAD-FE 1-20 MG-MCG PO TABS: 1.0000 | ORAL_TABLET | Freq: Every day | ORAL | 4 refills | Status: DC

## 2019-10-21 NOTE — Progress Notes (Signed)
lGynecology Annual Exam  PCP: Babs Sciara, MD  Chief Complaint  Patient presents with  . Annual Exam   History of Present Illness:  Ms. Lynn Rice is a 24 y.o. [id number] who LMP was Patient's last menstrual period was 10/14/2019., presents today for her annual examination.  Her menses are regular every 28-30 days, lasting 5 day(s).  Dysmenorrhea mild, occurring first 1-2 days of flow. She does not have intermenstrual bleeding.  She is sexually active. She uses the pill for contraception.  She takes Junel 1/20 Fe. She normally doesn't have pain with intercourse. The pain does not seem to be positional.  She notes the pain more on the outside. She doesn't have any sores.  She shaves the area. She doesn't note the pain around the area.   Last Pap: 2018.  Results were: no abnormalities /neg HPV DNA not done Hx of STDs: none  There is no FH of breast cancer. There is no FH of ovarian cancer. The patient does do self-breast exams.  Tobacco use: The patient denies current or previous tobacco use. Alcohol use: social drinker Exercise: yes.  [place]  Occasionally, she runs.   The patient wears seatbelts: yes.   The patient reports that domestic violence in her life is absent.   A couple of weeks ago she had some pain with wiping. She later had pain with intercourse. This has resolved. She had no pain with voiding.  Two weeks prior to her period coming on she had some spotting, which was really light.  She has since had a normal period.   Past Medical History:  Diagnosis Date  . No known health problems     Past Surgical History:  Procedure Laterality Date  . TONSILLECTOMY      Prior to Admission medications   Medication Sig Start Date End Date Taking? Authorizing Provider  norethindrone-ethinyl estradiol (JUNEL FE 1/20) 1-20 MG-MCG tablet Take 1 tablet by mouth daily. 10/12/19  Yes Conard Novak, MD  clindamycin-benzoyl peroxide [facility]) gel Apply  topically 2 (two) times daily. Patient not taking: Reported on 10/21/2019 07/23/19   Babs Sciara, MD   Allergies: No Known Allergies  Obstetric History: [id number]  Social History   Socioeconomic History  . Marital status: Married    Spouse name: Not on file  . Number of children: Not on file  . Years of education: Not on file  . Highest education level: Not on file  Occupational History  . Not on file  Tobacco Use  . Smoking status: Never Smoker  . Smokeless tobacco: Never Used  Substance and Sexual Activity  . Alcohol use: Yes  . Drug use: No  . Sexual activity: Yes    Partners: Male    Birth control/protection: Pill  Other Topics Concern  . Not on file  Social History Narrative  . Not on file   Social Determinants of Health   Financial Resource Strain:   . Difficulty of Paying Living Expenses:   Food Insecurity:   . Worried About Programme researcher, broadcasting/film/video in the Last Year:   . Barista in the Last Year:   Transportation Needs:   . Freight forwarder (Medical):   Marland Kitchen Lack of Transportation (Non-Medical):   Physical Activity:   . Days of Exercise per Week:   . Minutes of Exercise per Session:   Stress:   . Feeling of Stress :   Social Connections:   .  Frequency of Communication with Friends and Family:   . Frequency of Social Gatherings with Friends and Family:   . Attends Religious Services:   . Active Member of Clubs or Organizations:   . Attends Banker Meetings:   Marland Kitchen Marital Status:   Intimate Partner Violence:   . Fear of Current or Ex-Partner:   . Emotionally Abused:   Marland Kitchen Physically Abused:   . Sexually Abused:    Family History  Problem Relation Age of Onset  . Kidney disease Paternal Grandfather   . Heart failure Paternal Grandfather   . Cervical cancer Maternal Grandmother   . Breast cancer Neg Hx   . Ovarian cancer Neg Hx     Review of Systems  Constitutional: Negative.   HENT: Negative.   Eyes: Negative.   Respiratory:  Negative.   Cardiovascular: Negative.   Gastrointestinal: Negative.   Genitourinary: Negative.   Musculoskeletal: Negative.   Skin: Negative.   Neurological: Negative.   Psychiatric/Behavioral: Negative.      Physical Exam BP 122/70   Wt 135 lb (61.2 kg)   LMP 10/14/2019   BMI 22.47 kg/m    Physical Exam Constitutional:      General: She is not in acute distress.    Appearance: Normal appearance. She is well-developed.  Genitourinary:     Pelvic exam was performed with patient in the lithotomy position.     Vulva, urethra, bladder and uterus normal.     No inguinal adenopathy present in the right or left side.    No signs of injury in the vagina.     No vaginal discharge, erythema, tenderness or bleeding.     No cervical motion tenderness, discharge, lesion or polyp.     Uterus is mobile.     Uterus is not enlarged or tender.     No uterine mass detected.    Uterus is anteverted.     No right or left adnexal mass present.     Right adnexa not tender or full.     Left adnexa not tender or full.  HENT:     Head: Normocephalic and atraumatic.  Eyes:     General: No scleral icterus.    Conjunctiva/sclera: Conjunctivae normal.  Neck:     Thyroid: No thyromegaly.  Cardiovascular:     Rate and Rhythm: Normal rate and regular rhythm.     Heart sounds: No murmur. No friction rub. No gallop.   Pulmonary:     Effort: Pulmonary effort is normal. No respiratory distress.     Breath sounds: Normal breath sounds. No wheezing or rales.  Chest:     Breasts:        Right: No inverted nipple, mass, nipple discharge, skin change or tenderness.        Left: No inverted nipple, mass, nipple discharge, skin change or tenderness.  Abdominal:     General: Bowel sounds are normal. There is no distension.     Palpations: Abdomen is soft. There is no mass.     Tenderness: There is no abdominal tenderness. There is no guarding or rebound.  Musculoskeletal:        General: No swelling or  tenderness. Normal range of motion.     Cervical back: Normal range of motion and neck supple.  Lymphadenopathy:     Cervical: No cervical adenopathy.     Lower Body: No right inguinal adenopathy. No left inguinal adenopathy.  Neurological:     General: No focal deficit present.  Mental Status: She is alert and oriented to person, place, and time.     Cranial Nerves: No cranial nerve deficit.  Skin:    General: Skin is warm and dry.     Findings: No erythema or rash.  Psychiatric:        Mood and Affect: Mood normal.        Behavior: Behavior normal.        Judgment: Judgment normal.     Female chaperone present for pelvic and breast  portions of the physical exam  Results: AUDIT Questionnaire (screen for alcoholism): 3 PHQ-9: 0   Assessment: 24 y.o. G44P2002 female here for routine annual gynecologic examination  Plan: Problem List Items Addressed This Visit    None    Visit Diagnoses    Women's annual routine gynecological examination    -  Primary   Relevant Medications   norethindrone-ethinyl estradiol (JUNEL FE 1/20) 1-20 MG-MCG tablet   Other Relevant Orders   Cytology - PAP   Screening for depression       Screening for alcoholism       Pap smear for cervical cancer screening       Relevant Orders   Cytology - PAP   Encounter for surveillance of contraceptive pills       Relevant Medications   norethindrone-ethinyl estradiol (JUNEL FE 1/20) 1-20 MG-MCG tablet      Screening: -- Blood pressure screen normal -- Weight screening: normal -- Depression screening negative (PHQ-9) -- Nutrition: normal -- cholesterol screening: not due for screening -- osteoporosis screening: not due -- tobacco screening: not using -- alcohol screening: AUDIT questionnaire indicates low-risk usage. -- family history of breast cancer screening: done. not at high risk. -- no evidence of domestic violence or intimate partner violence. -- STD screening: gonorrhea/chlamydia  NAAT not collected per patient request. -- pap smear collected per ASCCP guidelines  Contraceptive management: rx refill provided on combined OCPs   Prentice Docker, MD 10/21/2019 3:06 PM

## 2019-10-23 LAB — CYTOLOGY - PAP: Diagnosis: NEGATIVE

## 2019-11-06 ENCOUNTER — Other Ambulatory Visit: Payer: Self-pay | Admitting: Obstetrics and Gynecology

## 2019-11-06 ENCOUNTER — Telehealth: Payer: Self-pay

## 2019-11-06 ENCOUNTER — Ambulatory Visit: Payer: Medicaid Other

## 2019-11-06 ENCOUNTER — Other Ambulatory Visit: Payer: Self-pay

## 2019-11-06 DIAGNOSIS — N3000 Acute cystitis without hematuria: Secondary | ICD-10-CM

## 2019-11-06 MED ORDER — NITROFURANTOIN MONOHYD MACRO 100 MG PO CAPS: 100.0000 mg | ORAL_CAPSULE | Freq: Two times a day (BID) | ORAL | 0 refills | Status: DC

## 2019-11-06 NOTE — Telephone Encounter (Signed)
Rx macrobid in

## 2019-11-06 NOTE — Progress Notes (Signed)
UTI symptoms home test positive.  Prior cultures showed E. Coli macrobid sensitive.  Rx macrobid 5 day course if fails to improve or symptoms relapse short interval to return for culture

## 2019-11-06 NOTE — Telephone Encounter (Signed)
LMVM to notify rx sent in. Per AMS will not sent culture today. All previous cultures show E Coli. Patient advised to take all meds and if symptoms recur within 1-2 weeks then she will need to return for culture to be sent. Also advised can contact our office to discuss refund of charges paid today d/t insurance.

## 2019-11-06 NOTE — Telephone Encounter (Signed)
Patient reports she used AZO UTI TEST STRIP. Results +Leuks, - Nitrate. Inquiring if AZO Antibacterial protection would help or if she needs apt/can have something called in. [phone]

## 2019-11-06 NOTE — Telephone Encounter (Signed)
Spoke w/patient. She advised she had some soreness when wiping (no burning) prior to her AE 10/21/19 w/[facility]. That went away but started again a few days ago and this morning she has starting experiencing burning w/urination. Appointment made for nurse visit today.

## 2020-02-10 ENCOUNTER — Other Ambulatory Visit: Payer: Self-pay | Admitting: Obstetrics and Gynecology

## 2020-02-10 ENCOUNTER — Telehealth: Payer: Self-pay

## 2020-02-10 MED ORDER — NITROFURANTOIN MONOHYD MACRO 100 MG PO CAPS: 100.0000 mg | ORAL_CAPSULE | Freq: Two times a day (BID) | ORAL | 0 refills | Status: AC

## 2020-02-10 NOTE — Progress Notes (Signed)
Rx macrobid for UTI sx. RTO for UA if sx persist after tx

## 2020-02-10 NOTE — Telephone Encounter (Signed)
Rx macrobid eRxd. Pls notify pt. RTO for UA if sx persist after tx. Thx

## 2020-02-10 NOTE — Telephone Encounter (Signed)
Pt aware rx sent in.  Wanted to know what to do to prevent UIT's.  Adv to void after sex, don't hold urine too long just go ahead and void.

## 2020-02-10 NOTE — Telephone Encounter (Signed)
Pt calling to see if [facility] will rx something for UTI; took AZO pills last night;  Did the AZO UTI at home test; showed positive leuks; couldn't read nitrates d/t taking the AZO; has hx of UTIs.  [phone]

## 2020-05-05 ENCOUNTER — Encounter: Payer: Self-pay | Admitting: Advanced Practice Midwife

## 2020-05-05 ENCOUNTER — Other Ambulatory Visit: Payer: Self-pay

## 2020-05-05 ENCOUNTER — Ambulatory Visit (INDEPENDENT_AMBULATORY_CARE_PROVIDER_SITE_OTHER): Payer: Self-pay | Admitting: Advanced Practice Midwife

## 2020-05-05 VITALS — BP 110/60 | Ht 65.0 in | Wt 139.0 lb

## 2020-05-05 DIAGNOSIS — R350 Frequency of micturition: Secondary | ICD-10-CM

## 2020-05-05 DIAGNOSIS — R399 Unspecified symptoms and signs involving the genitourinary system: Secondary | ICD-10-CM

## 2020-05-05 DIAGNOSIS — R300 Dysuria: Secondary | ICD-10-CM

## 2020-05-05 LAB — POCT URINALYSIS DIPSTICK
Bilirubin, UA: NEGATIVE
Blood, UA: NEGATIVE
Glucose, UA: NEGATIVE
Ketones, UA: NEGATIVE
Nitrite, UA: NEGATIVE
Protein, UA: POSITIVE — AB
pH, UA: 5 (ref 5.0–8.0)

## 2020-05-05 MED ORDER — CEPHALEXIN 500 MG PO CAPS: 500.0000 mg | ORAL_CAPSULE | Freq: Three times a day (TID) | ORAL | 0 refills | Status: DC

## 2020-05-05 NOTE — Progress Notes (Signed)
Patient ID: Lynn Rice, female   DOB: May 13, 1996, 24 y.o.   MRN: [id number]  Reason for Visit: Urinary Tract Infection (Frequency and burning urinating since last night)    Subjective:  HPI:  Lynn Rice is a 24 y.o. female being seen for symptoms of UTI that began last night. She has burning and frequency of urination. She denies urgency. She denies vaginal symptoms of itching, irritation, discharge or odor. She has a past history of several UTIs per year.  We discussed preventive measures including: adequate hydration, good hygiene after intercourse and toileting, dilute baking soda for pH correction, cranberry products. Will send urine for culture and start on antibiotics empirically given her symptoms.  Past Medical History:  Diagnosis Date  . No known health problems    Family History  Problem Relation Age of Onset  . Kidney disease Paternal Grandfather   . Heart failure Paternal Grandfather   . Cervical cancer Maternal Grandmother        not sure of age  . Breast cancer Neg Hx   . Ovarian cancer Neg Hx    Past Surgical History:  Procedure Laterality Date  . TONSILLECTOMY      Short Social History:  Social History   Tobacco Use  . Smoking status: Never Smoker  . Smokeless tobacco: Never Used  Substance Use Topics  . Alcohol use: Yes    No Known Allergies  Current Outpatient Medications  Medication Sig Dispense Refill  . clindamycin-benzoyl peroxide (BENZACLIN) gel Apply topically 2 (two) times daily. 25 g 2  . norethindrone-ethinyl estradiol (JUNEL FE 1/20) 1-20 MG-MCG tablet Take 1 tablet by mouth daily. 3 Package 4  . cephALEXin (KEFLEX) 500 MG capsule Take 1 capsule (500 mg total) by mouth 3 (three) times daily. 21 capsule 0   No current facility-administered medications for this visit.   Review of Systems  Constitutional: Negative for chills and fever.  HENT: Negative for congestion, ear discharge, ear pain, hearing loss, sinus pain and  sore throat.   Eyes: Negative for blurred vision and double vision.  Respiratory: Negative for cough, shortness of breath and wheezing.   Cardiovascular: Negative for chest pain, palpitations and leg swelling.  Gastrointestinal: Negative for abdominal pain, blood in stool, constipation, diarrhea, heartburn, melena, nausea and vomiting.  Genitourinary: Positive for dysuria and frequency. Negative for flank pain, hematuria and urgency.  Musculoskeletal: Negative for back pain, joint pain and myalgias.  Skin: Negative for itching and rash.  Neurological: Negative for dizziness, tingling, tremors, sensory change, speech change, focal weakness, seizures, loss of consciousness, weakness and headaches.  Endo/Heme/Allergies: Negative for environmental allergies. Does not bruise/bleed easily.  Psychiatric/Behavioral: Negative for depression, hallucinations, memory loss, substance abuse and suicidal ideas. The patient is not nervous/anxious and does not have insomnia.        Objective:  Objective   Vitals:   05/05/20 1321  BP: 110/60  Weight: 139 lb (63 kg)  Height: 5\' 5"  (1.651 m)   Body mass index is 23.13 kg/m. Constitutional: Well nourished, well developed female in no acute distress.  HEENT: normal Skin: Warm and dry.  Respiratory: Clear to auscultation bilateral. Normal respiratory effort Abdomen: soft, nontender, nondistended, no abnormal masses, no epigastric pain Back: no CVAT Neuro: DTRs 2+, Cranial nerves grossly intact Psych: Alert and Oriented x3. No memory deficits. Normal mood and affect.  MS: normal gait, normal bilateral lower extremity ROM/strength/stability.    Data: Results for SHONTAE, ROSILES (MRN Marisa Cyphers) as of 05/05/2020 13:41  Ref. Range 05/05/2020 13:31  Bilirubin, UA Unknown neg  Glucose Latest Ref Range: Negative  Negative  Ketones, UA Unknown neg  Leukocytes,UA Latest Ref Range: Negative  Small (1+) (A)  Nitrite, UA Unknown neg  pH, UA Latest Ref  Range: 5.0 - 8.0  5.0  Protein,UA Latest Ref Range: Negative  Positive (A)  RBC, UA Unknown neg   Assessment/Plan:     23 y.o. G2 P34 female with UTI symptoms, possible UTI  Rx Keflex Follow up as needed after urine culture results   Tresea Mall CNM [facility] 05/05/2020, 1:54 PM

## 2020-05-10 LAB — URINE CULTURE

## 2020-10-21 ENCOUNTER — Ambulatory Visit: Payer: Medicaid Other | Admitting: Obstetrics and Gynecology

## 2020-11-04 ENCOUNTER — Ambulatory Visit: Payer: Medicaid Other | Admitting: Obstetrics and Gynecology

## 2020-11-07 ENCOUNTER — Other Ambulatory Visit: Payer: Self-pay

## 2020-11-07 ENCOUNTER — Ambulatory Visit (INDEPENDENT_AMBULATORY_CARE_PROVIDER_SITE_OTHER): Payer: Medicaid Other | Admitting: Obstetrics and Gynecology

## 2020-11-07 ENCOUNTER — Encounter: Payer: Self-pay | Admitting: Obstetrics and Gynecology

## 2020-11-07 VITALS — BP 110/60 | Ht 65.0 in | Wt 142.0 lb

## 2020-11-07 DIAGNOSIS — Z1339 Encounter for screening examination for other mental health and behavioral disorders: Secondary | ICD-10-CM | POA: Diagnosis not present

## 2020-11-07 DIAGNOSIS — Z3041 Encounter for surveillance of contraceptive pills: Secondary | ICD-10-CM

## 2020-11-07 DIAGNOSIS — Z1331 Encounter for screening for depression: Secondary | ICD-10-CM | POA: Diagnosis not present

## 2020-11-07 DIAGNOSIS — Z01419 Encounter for gynecological examination (general) (routine) without abnormal findings: Secondary | ICD-10-CM | POA: Diagnosis not present

## 2020-11-07 MED ORDER — NORETHIN ACE-ETH ESTRAD-FE 1-20 MG-MCG PO TABS: 1.0000 | ORAL_TABLET | Freq: Every day | ORAL | 4 refills | Status: DC

## 2020-11-07 NOTE — Progress Notes (Signed)
Gynecology Annual Exam  PCP: Babs Sciara, MD  Chief Complaint  Patient presents with   Gynecologic Exam    No concerns   History of Present Illness:  Ms. Lynn Rice is a 25 y.o. [phone] who LMP was Patient's last menstrual period was 10/11/2020 (approximate)., presents today for her annual examination.  Her menses are regular every 28-30 days, lasting 4 day(s).  Dysmenorrhea mild, occurring first 1-2 days of flow. She does not have intermenstrual bleeding.  She is sexually active. No pain with intercourse. Using combined OCPs. .  Last Pap: 09/2019  Results were: no abnormalities /neg HPV DNA not done (age) Hx of STDs: none  There is no FH of breast cancer. There is no FH of ovarian cancer. The patient does do self-breast exams.  Tobacco use: The patient denies current or previous tobacco use. Alcohol use: social drinker Exercise: yes.   She has a history of frequent UTIs.  The last time she was treated for a UTI was a month or two ago.    The patient wears seatbelts: yes.   The patient reports that domestic violence in her life is absent.   Past Medical History:  Diagnosis Date   No known health problems     Past Surgical History:  Procedure Laterality Date   TONSILLECTOMY      Prior to Admission medications   Medication Sig Start Date End Date Taking? Authorizing Provider  norethindrone-ethinyl estradiol (JUNEL FE 1/20) 1-20 MG-MCG tablet Take 1 tablet by mouth daily. 10/21/19  Yes Conard Novak, MD   Allergies: No Known Allergies  Obstetric History: [id number]  Social History   Socioeconomic History   Marital status: Married    Spouse name: Not on file   Number of children: Not on file   Years of education: Not on file   Highest education level: Not on file  Occupational History   Not on file  Tobacco Use   Smoking status: Never   Smokeless tobacco: Never  Vaping Use   Vaping Use: Never used  Substance and Sexual Activity   Alcohol use: Yes    Drug use: No   Sexual activity: Yes    Partners: Male    Birth control/protection: Pill  Other Topics Concern   Not on file  Social History Narrative   Not on file   Social Determinants of Health   Financial Resource Strain: Not on file  Food Insecurity: Not on file  Transportation Needs: Not on file  Physical Activity: Not on file  Stress: Not on file  Social Connections: Not on file  Intimate Partner Violence: Not on file    Family History  Problem Relation Age of Onset   Cervical cancer Maternal Grandmother        not sure of age   Kidney disease Paternal Grandfather    Heart failure Paternal Grandfather    Breast cancer Neg Hx    Ovarian cancer Neg Hx     Review of Systems  Constitutional: Negative.   HENT: Negative.    Eyes: Negative.   Respiratory: Negative.    Cardiovascular: Negative.   Gastrointestinal: Negative.   Genitourinary: Negative.   Musculoskeletal: Negative.   Skin: Negative.   Neurological: Negative.   Psychiatric/Behavioral: Negative.      Physical Exam BP 110/60   Ht 5\' 5"  (1.651 m)   Wt 142 lb (64.4 kg)   LMP 10/11/2020 (Approximate)   BMI 23.63 kg/m    Physical Exam Constitutional:  General: She is not in acute distress.    Appearance: Normal appearance. She is well-developed.  Genitourinary:     Vulva and bladder normal.     Right Labia: No rash, tenderness, lesions, skin changes or Bartholin's cyst.    Left Labia: No tenderness, lesions, skin changes, Bartholin's cyst or rash.    No inguinal adenopathy present in the right or left side.    Pelvic Tanner Score: 5/5.    No vaginal discharge, erythema, tenderness or bleeding.      Right Adnexa: not tender, not full and no mass present.    Left Adnexa: not tender, not full and no mass present.    No cervical motion tenderness, discharge, lesion or polyp.     Uterus is not enlarged or tender.     No uterine mass detected.    Pelvic exam was performed with patient in the  lithotomy position.  Breasts:    Right: No inverted nipple, mass, nipple discharge, skin change or tenderness.     Left: No inverted nipple, mass, nipple discharge, skin change or tenderness.  HENT:     Head: Normocephalic and atraumatic.  Eyes:     General: No scleral icterus.    Conjunctiva/sclera: Conjunctivae normal.  Neck:     Thyroid: No thyromegaly.  Cardiovascular:     Rate and Rhythm: Normal rate and regular rhythm.     Heart sounds: No murmur heard.   No friction rub. No gallop.  Pulmonary:     Effort: Pulmonary effort is normal. No respiratory distress.     Breath sounds: Normal breath sounds. No wheezing or rales.  Abdominal:     General: Bowel sounds are normal. There is no distension.     Palpations: Abdomen is soft. There is no mass.     Tenderness: There is no abdominal tenderness. There is no guarding or rebound.     Hernia: There is no hernia in the left inguinal area or right inguinal area.  Musculoskeletal:        General: No swelling or tenderness. Normal range of motion.     Cervical back: Normal range of motion and neck supple.  Lymphadenopathy:     Cervical: No cervical adenopathy.     Lower Body: No right inguinal adenopathy. No left inguinal adenopathy.  Neurological:     General: No focal deficit present.     Mental Status: She is alert and oriented to person, place, and time.     Cranial Nerves: No cranial nerve deficit.  Skin:    General: Skin is warm and dry.     Findings: No erythema or rash.  Psychiatric:        Mood and Affect: Mood normal.        Behavior: Behavior normal.        Judgment: Judgment normal.    Female chaperone present for pelvic and breast  portions of the physical exam  Results: AUDIT Questionnaire (screen for alcoholism): 3 PHQ-9: 1  Assessment: 25 y.o. G76P2002 female here for routine annual gynecologic examination  Plan: Problem List Items Addressed This Visit   None Visit Diagnoses     Women's annual routine  gynecological examination    -  Primary   Relevant Medications   norethindrone-ethinyl estradiol-FE (JUNEL FE 1/20) 1-20 MG-MCG tablet   Screening for depression       Screening for alcoholism       Encounter for surveillance of contraceptive pills       Relevant  Medications   norethindrone-ethinyl estradiol-FE (JUNEL FE 1/20) 1-20 MG-MCG tablet       Screening: -- Blood pressure screen normal -- Weight screening: normal -- Depression screening negative (PHQ-9) -- Nutrition: normal -- cholesterol screening: not due for screening -- osteoporosis screening: not due -- tobacco screening: not using -- alcohol screening: AUDIT questionnaire indicates low-risk usage. -- family history of breast cancer screening: done. not at high risk. -- no evidence of domestic violence or intimate partner violence. -- STD screening: gonorrhea/chlamydia NAAT not collected per patient request. -- pap smear not collected per ASCCP guidelines -- HPV vaccination series: received -- s/p COVID19 vaccine  Recurrent UTIs: if develops sx, will send in Rx with refills.   Thomasene Mohair, MD 11/07/2020 9:58 AM

## 2021-04-08 ENCOUNTER — Ambulatory Visit
Admission: EM | Admit: 2021-04-08 | Discharge: 2021-04-08 | Disposition: A | Discharge status: Home / Self Care | Payer: 59 | Attending: Urgent Care | Admitting: Urgent Care

## 2021-04-08 ENCOUNTER — Other Ambulatory Visit: Payer: Self-pay

## 2021-04-08 DIAGNOSIS — B349 Viral infection, unspecified: Secondary | ICD-10-CM | POA: Diagnosis not present

## 2021-04-08 DIAGNOSIS — Z20828 Contact with and (suspected) exposure to other viral communicable diseases: Secondary | ICD-10-CM | POA: Diagnosis not present

## 2021-04-08 MED ORDER — OSELTAMIVIR PHOSPHATE 75 MG PO CAPS: 75.0000 mg | ORAL_CAPSULE | Freq: Two times a day (BID) | ORAL | 0 refills | Status: DC

## 2021-04-08 NOTE — ED Triage Notes (Signed)
Pt presents with fever and body aches that began yesterday, husband positive for flu

## 2021-04-08 NOTE — ED Provider Notes (Signed)
[facility]   MRN: [id number] DOB: 10/12/1995  Subjective:   Lynn Rice is a 25 y.o. female presenting for 1 day history of acute onset fever, body aches, malaise and fatigue. Had exposure to influenza through her husband who tested positive for Flu A. No chest pain, shob, wheezing. She is otherwise healthy.   No current facility-administered medications for this encounter.  Current Outpatient Medications:    clindamycin-benzoyl peroxide (BENZACLIN) gel, Apply topically 2 (two) times daily., Disp: 25 g, Rfl: 2   norethindrone-ethinyl estradiol-FE (JUNEL FE 1/20) 1-20 MG-MCG tablet, Take 1 tablet by mouth daily., Disp: 84 tablet, Rfl: 4   No Known Allergies  Past Medical History:  Diagnosis Date   No known health problems      Past Surgical History:  Procedure Laterality Date   TONSILLECTOMY      Family History  Problem Relation Age of Onset   Cervical cancer Maternal Grandmother        not sure of age   Kidney disease Paternal Grandfather    Heart failure Paternal Grandfather    Breast cancer Neg Hx    Ovarian cancer Neg Hx     Social History   Tobacco Use   Smoking status: Never   Smokeless tobacco: Never  Vaping Use   Vaping Use: Never used  Substance Use Topics   Alcohol use: Yes   Drug use: No    ROS   Objective:   Vitals: BP 118/77   Pulse 95   Temp 98.1 F (36.7 C)   Resp 20   LMP 03/27/2021   SpO2 98%   Physical Exam Constitutional:      General: She is not in acute distress.    Appearance: Normal appearance. She is well-developed. She is not ill-appearing, toxic-appearing or diaphoretic.  HENT:     Head: Normocephalic and atraumatic.     Right Ear: Tympanic membrane and ear canal normal. No drainage or tenderness. No middle ear effusion. Tympanic membrane is not erythematous.     Left Ear: Tympanic membrane and ear canal normal. No drainage or tenderness.  No middle ear effusion. Tympanic membrane is not  erythematous.     Nose: Nose normal. No congestion or rhinorrhea.     Mouth/Throat:     Mouth: Mucous membranes are moist. No oral lesions.     Pharynx: Oropharynx is clear. No pharyngeal swelling, oropharyngeal exudate, posterior oropharyngeal erythema or uvula swelling.     Tonsils: No tonsillar exudate or tonsillar abscesses.  Eyes:     Extraocular Movements: Extraocular movements intact.     Right eye: Normal extraocular motion.     Left eye: Normal extraocular motion.     Conjunctiva/sclera: Conjunctivae normal.     Pupils: Pupils are equal, round, and reactive to light.  Cardiovascular:     Rate and Rhythm: Normal rate and regular rhythm.     Pulses: Normal pulses.     Heart sounds: Normal heart sounds. No murmur heard.   No friction rub. No gallop.  Pulmonary:     Effort: Pulmonary effort is normal. No respiratory distress.     Breath sounds: Normal breath sounds. No stridor. No wheezing, rhonchi or rales.  Musculoskeletal:     Cervical back: Normal range of motion and neck supple.  Lymphadenopathy:     Cervical: No cervical adenopathy.  Skin:    General: Skin is warm and dry.     Findings: No rash.  Neurological:     General: No  focal deficit present.     Mental Status: She is alert and oriented to person, place, and time.     Cranial Nerves: No cranial nerve deficit.     Motor: No weakness.     Coordination: Coordination normal.     Gait: Gait normal.     Deep Tendon Reflexes: Reflexes normal.  Psychiatric:        Mood and Affect: Mood normal.        Behavior: Behavior normal.        Thought Content: Thought content normal.        Judgment: Judgment normal.    Assessment and Plan :   PDMP not reviewed this encounter.  1. Acute viral syndrome   2. Exposure to influenza    Deferred imaging given clear cardiopulmonary exam, hemodynamically stable vital signs. Will cover for influenza with Tamiflu given exposure, symptom set, current incidence in the community.   Use supportive care, rest, fluids, hydration, light meals, schedule Tylenol and ibuprofen. Counseled patient on potential for adverse effects with medications prescribed today, patient verbalized understanding. ER and return-to-clinic precautions discussed, patient verbalized understanding.    Wallis Bamberg, [place] 04/08/21 [phone]

## 2021-04-08 NOTE — Discharge Instructions (Addendum)
We will manage this as a viral illness like influenza. For sore throat or cough try using a honey-based tea. Use 3 teaspoons of honey with juice squeezed from half lemon. Place shaved pieces of ginger into 1/2-1 cup of water and warm over stove top. Then mix the ingredients and repeat every 4 hours as needed. Please take ibuprofen 600mg  every 6 hours with food alternating with OR taken together with Tylenol 500mg -650mg  every 6 hours for throat pain, fevers, aches and pains. Hydrate very well with at least 2 liters of water. Eat light meals such as soups (chicken and noodles, vegetable, chicken and wild rice).  Do not eat foods that you are allergic to.  Taking an antihistamine like Zyrtec can help against postnasal drainage, sinus congestion.  You can take this together with pseudoephedrine (Sudafed) at a dose of 30-60 mg 3 times a day or twice daily as needed for the same kind of nasal drip, congestion.

## 2021-11-27 ENCOUNTER — Telehealth: Payer: Self-pay

## 2021-11-27 ENCOUNTER — Other Ambulatory Visit: Payer: Self-pay

## 2021-11-27 DIAGNOSIS — Z01419 Encounter for gynecological examination (general) (routine) without abnormal findings: Secondary | ICD-10-CM

## 2021-11-27 DIAGNOSIS — Z3041 Encounter for surveillance of contraceptive pills: Secondary | ICD-10-CM

## 2021-11-27 MED ORDER — NORETHIN ACE-ETH ESTRAD-FE 1-20 MG-MCG PO TABS: 1.0000 | ORAL_TABLET | Freq: Every day | ORAL | 0 refills | Status: DC

## 2021-11-27 NOTE — Telephone Encounter (Signed)
Called patient back and told her I can send in one refill to hold her over until her appointment

## 2021-11-27 NOTE — Telephone Encounter (Signed)
Patient is scheduled for 12/11/2021. Not 01/18/22.

## 2021-11-27 NOTE — Telephone Encounter (Signed)
Patient called into triage needing a refill on her birth control pills.   Her annual was already scheduled for 01/18/22.

## 2021-12-11 ENCOUNTER — Ambulatory Visit (INDEPENDENT_AMBULATORY_CARE_PROVIDER_SITE_OTHER): Payer: 59 | Admitting: Obstetrics & Gynecology

## 2021-12-11 ENCOUNTER — Encounter: Payer: Self-pay | Admitting: Obstetrics & Gynecology

## 2021-12-11 VITALS — BP 100/70 | Ht 65.0 in | Wt 145.6 lb

## 2021-12-11 DIAGNOSIS — Z01419 Encounter for gynecological examination (general) (routine) without abnormal findings: Secondary | ICD-10-CM | POA: Diagnosis not present

## 2021-12-11 DIAGNOSIS — Z3041 Encounter for surveillance of contraceptive pills: Secondary | ICD-10-CM

## 2021-12-11 MED ORDER — NORETHIN ACE-ETH ESTRAD-FE 1-20 MG-MCG PO TABS: 1.0000 | ORAL_TABLET | Freq: Every day | ORAL | 5 refills | Status: AC

## 2021-12-11 NOTE — Progress Notes (Signed)
Subjective:    Lynn Rice is a 26 y.o. married P2 (4 and 14 yo kids)  who presents for an annual exam. The patient has no complaints today. She would like a refill of her OCPs. The patient is sexually active. GYN screening history: last pap: was normal. The patient wears seatbelts: yes. The patient participates in regular exercise: yes. Has the patient ever been transfused or tattooed?: no. The patient reports that there is not domestic violence in her life.   Menstrual History: OB History     Gravida  2   Para  2   Term  2   Preterm      AB      Living  2      SAB      IAB      Ectopic      Multiple  0   Live Births  2            Patient's last menstrual period was 12/05/2021. Period Cycle (Days): -2 Period Duration (Days): 3-5 Period Pattern: Regular Menstrual Flow: Moderate Dysmenorrhea: (!) Mild Dysmenorrhea Symptoms: Cramping  The following portions of the patient's history were reviewed and updated as appropriate: allergies, current medications, past family history, past medical history, past social history, past surgical history, and problem list.  Review of Systems Pertinent items are noted in HPI.  She denies any FH of breast/gyn/colon cancers. She reports that her husband is considering a vasectomy. She is a Furniture conservator/restorer.   Objective:    BP 100/70   Ht 5\' 5"  (1.651 m)   Wt 145 lb 9.6 oz (66 kg)   LMP 12/05/2021   BMI 24.23 kg/m   General Appearance:    Alert, cooperative, no distress, appears stated age  Head:    Normocephalic, without obvious abnormality, atraumatic  Eyes:    PERRL, conjunctiva/corneas clear, EOM's intact, fundi    benign, both eyes  Ears:    Normal TM's and external ear canals, both ears  Nose:   Nares normal, septum midline, mucosa normal, no drainage    or sinus tenderness  Throat:   Lips, mucosa, and tongue normal; teeth and gums normal  Neck:   Supple, symmetrical, trachea midline, no adenopathy;    thyroid:   no enlargement/tenderness/nodules; no carotid   bruit or JVD  Back:     Symmetric, no curvature, ROM normal, no CVA tenderness  Lungs:     Clear to auscultation bilaterally, respirations unlabored  Chest Wall:    No tenderness or deformity   Heart:    Regular rate and rhythm, S1 and S2 normal, no murmur, rub   or gallop  Breast Exam:    No tenderness, masses, or nipple abnormality  Abdomen:     Soft, non-tender, bowel sounds active all four quadrants,    no masses, no organomegaly  Genitalia:     Normal female without lesion, discharge or tenderness, normal size and shape, anteverted, normal adnexal exam  Rectal:    Deferred  Extremities:   Extremities normal, atraumatic, no cyanosis or edema  Pulses:   2+ and symmetric all extremities  Skin:   Skin color, texture, turgor normal, no rashes or lesions  Lymph nodes:   Cervical, supraclavicular, and axillary nodes normal  Neurologic:   CNII-XII intact, normal strength, sensation and reflexes    throughout  .    Assessment:    Healthy female exam.    Plan:     According to ACOG guidelines,  she does not need a pap smear today- will get that next year. I will refill her OCPS. I rec'd that she get fasting labs at her annual next year.

## 2022-02-06 DIAGNOSIS — H5203 Hypermetropia, bilateral: Secondary | ICD-10-CM | POA: Diagnosis not present

## 2022-02-06 DIAGNOSIS — Z0100 Encounter for examination of eyes and vision without abnormal findings: Secondary | ICD-10-CM | POA: Diagnosis not present

## 2022-07-16 ENCOUNTER — Encounter: Payer: Self-pay | Admitting: Obstetrics and Gynecology

## 2022-07-16 ENCOUNTER — Ambulatory Visit (INDEPENDENT_AMBULATORY_CARE_PROVIDER_SITE_OTHER): Payer: 59 | Admitting: Obstetrics and Gynecology

## 2022-07-16 VITALS — BP 100/60 | Ht 65.0 in | Wt 140.0 lb

## 2022-07-16 DIAGNOSIS — N898 Other specified noninflammatory disorders of vagina: Secondary | ICD-10-CM | POA: Diagnosis not present

## 2022-07-16 LAB — POCT WET PREP WITH KOH
Clue Cells Wet Prep HPF POC: NEGATIVE
KOH Prep POC: NEGATIVE
Trichomonas, UA: NEGATIVE
Yeast Wet Prep HPF POC: NEGATIVE

## 2022-07-16 MED ORDER — FLUCONAZOLE 150 MG PO TABS: 150.0000 mg | ORAL_TABLET | Freq: Once | ORAL | 0 refills | Status: AC

## 2022-07-16 NOTE — Progress Notes (Signed)
Kathyrn Drown, MD   Chief Complaint  Patient presents with   Vaginal Irritation    No discharge, odor or itching x 2 days    HPI:      Ms. Lynn Rice is a 27 y.o. [id number] whose LMP was Patient's last menstrual period was 06/18/2022 (approximate)., presents today for vaginal itching/irritation for 2 days, no increased d/c or odor. No new soap/detergents/shower gels. Uses olay body wash, no dryer sheets. Wears thongs sometimes. No new partners. No urin sx. No recent abx use or meds to treat.   Past Medical History:  Diagnosis Date   No known health problems     Past Surgical History:  Procedure Laterality Date   TONSILLECTOMY      Family History  Problem Relation Age of Onset   Cervical cancer Maternal Grandmother        not sure of age   Kidney disease Paternal Grandfather    Heart failure Paternal Grandfather    Breast cancer Neg Hx    Ovarian cancer Neg Hx     Social History   Socioeconomic History   Marital status: Married    Spouse name: Not on file   Number of children: Not on file   Years of education: Not on file   Highest education level: Not on file  Occupational History   Not on file  Tobacco Use   Smoking status: Never   Smokeless tobacco: Never  Vaping Use   Vaping Use: Never used  Substance and Sexual Activity   Alcohol use: Yes   Drug use: No   Sexual activity: Yes    Partners: Male    Birth control/protection: Pill  Other Topics Concern   Not on file  Social History Narrative   Not on file   Social Determinants of Health   Financial Resource Strain: Not on file  Food Insecurity: Not on file  Transportation Needs: Not on file  Physical Activity: Not on file  Stress: Not on file  Social Connections: Not on file  Intimate Partner Violence: Not on file    Outpatient Medications Prior to Visit  Medication Sig Dispense Refill   norethindrone-ethinyl estradiol-FE (JUNEL FE 1/20) 1-20 MG-MCG tablet Take 1 tablet by mouth  daily. 84 tablet 5   clindamycin-benzoyl peroxide (BENZACLIN) gel Apply topically 2 (two) times daily. 25 g 2   No facility-administered medications prior to visit.      ROS:  Review of Systems  Constitutional:  Negative for fever.  Gastrointestinal:  Negative for blood in stool, constipation, diarrhea, nausea and vomiting.  Genitourinary:  Negative for dyspareunia, dysuria, flank pain, frequency, hematuria, urgency, vaginal bleeding, vaginal discharge and vaginal pain.  Musculoskeletal:  Negative for back pain.  Skin:  Negative for rash.   OBJECTIVE:   Vitals:  BP 100/60   Ht 5' 5"$  (1.651 m)   Wt 140 lb (63.5 kg)   LMP 06/18/2022 (Approximate)   BMI 23.30 kg/m   Physical Exam Vitals reviewed.  Constitutional:      Appearance: She is well-developed.  Pulmonary:     Effort: Pulmonary effort is normal.  Genitourinary:    General: Normal vulva.     Pubic Area: No rash.      Labia:        Right: No rash, tenderness or lesion.        Left: No rash, tenderness or lesion.      Vagina: Normal. No vaginal discharge, erythema or tenderness.  Cervix: Normal.     Uterus: Normal. Not enlarged and not tender.      Adnexa: Right adnexa normal and left adnexa normal.       Right: No mass or tenderness.         Left: No mass or tenderness.      Musculoskeletal:        General: Normal range of motion.     Cervical back: Normal range of motion.  Skin:    General: Skin is warm and dry.  Neurological:     General: No focal deficit present.     Mental Status: She is alert and oriented to person, place, and time.  Psychiatric:        Mood and Affect: Mood normal.        Behavior: Behavior normal.        Thought Content: Thought content normal.        Judgment: Judgment normal.     Results: Results for orders placed or performed in visit on 07/16/22 (from the past 24 hour(s))  POCT Wet Prep with KOH     Status: Normal   Collection Time: 07/16/22  2:49 PM  Result Value  Ref Range   Trichomonas, UA Negative    Clue Cells Wet Prep HPF POC NEG    Epithelial Wet Prep HPF POC     Yeast Wet Prep HPF POC neg    Bacteria Wet Prep HPF POC     RBC Wet Prep HPF POC     WBC Wet Prep HPF POC     KOH Prep POC Negative Negative     Assessment/Plan: Vaginal itching - Plan: fluconazole (DIFLUCAN) 150 MG tablet, POCT Wet Prep with KOH; neg wet prep, neg exam. Treat empirically with diflucan. F/u prn.    Meds ordered this encounter  Medications   fluconazole (DIFLUCAN) 150 MG tablet    Sig: Take 1 tablet (150 mg total) by mouth once for 1 dose.    Dispense:  1 tablet    Refill:  0    Order Specific Question:   Supervising Provider    Answer:   Rubie Maid [id number]      Return if symptoms worsen or fail to improve.  Riven Mabile B. Rebekha Diveley, PA-C 07/16/2022 2:50 PM

## 2022-07-23 ENCOUNTER — Ambulatory Visit (INDEPENDENT_AMBULATORY_CARE_PROVIDER_SITE_OTHER): Payer: 59

## 2022-07-23 ENCOUNTER — Ambulatory Visit
Admission: EM | Admit: 2022-07-23 | Discharge: 2022-07-23 | Disposition: A | Discharge status: Home / Self Care | Payer: 59 | Attending: Nurse Practitioner | Admitting: Nurse Practitioner

## 2022-07-23 DIAGNOSIS — M25561 Pain in right knee: Secondary | ICD-10-CM

## 2022-07-23 MED ORDER — IBUPROFEN 800 MG PO TABS: 800.0000 mg | ORAL_TABLET | Freq: Three times a day (TID) | ORAL | 0 refills | Status: DC

## 2022-07-23 NOTE — ED Triage Notes (Signed)
Right knee pain, patient states she has been having pain in her right knee for a few months on and off but last night the pain got more severe and having trouble walking and trying to bend or move her right knee.

## 2022-07-23 NOTE — Discharge Instructions (Addendum)
The x-ray of your knee was negative for fracture or dislocation.  No other abnormal findings were present on the x-ray. Take medication as prescribed.  Take medication with food and water. Use the knee brace to provide compression and support.  Make sure you are wearing the knee brace with strenuous or prolonged activity. RICE therapy, rest, ice, compression, and elevation.  When applying ice, apply for 20 minutes, remove for 1 hour, then repeat is much as possible. Gentle stretching and range of motion exercises are encouraged. Recommend following up with orthopedics for further evaluation.  He can follow-up with [facility] at [phone] or with [facility] at [phone]. Follow-up as needed.

## 2022-07-23 NOTE — ED Provider Notes (Signed)
[facility]    CSN: [id number] Arrival date & time: 07/23/22  0800      History   Chief Complaint Chief Complaint  Patient presents with   Knee Pain    HPI Lynn Rice is a 27 y.o. female.   The history is provided by the patient.   The patient presents for worsening right knee pain that has been present for the past several months.  The patient denies any obvious known injury or trauma to the right leg/knee.  She states over the last 2 weeks, her knee pain has worsened.  She states pain is on the outside of the knee at the kneecap.  She states that her husband told her the knee was swollen this morning, but she has not noticed any obvious swelling.  Patient describes her knee pain as "throbbing" and when she begins walking on the knee, it changes to "sharp".  She further denies weakness, numbness, tingling, or radiation of right knee pain.  She states when she turns the hip or ankle a certain way, she can feel the pain more.  She states that she has been taking over-the-counter ibuprofen for pain or discomfort.  She states at this time, she is having increased pain which is causing her difficulty bearing weight.  Past Medical History:  Diagnosis Date   No known health problems     There are no problems to display for this patient.   Past Surgical History:  Procedure Laterality Date   TONSILLECTOMY      OB History     Gravida  2   Para  2   Term  2   Preterm      AB      Living  2      SAB      IAB      Ectopic      Multiple  0   Live Births  2            Home Medications    Prior to Admission medications   Medication Sig Start Date End Date Taking? Authorizing Provider  ibuprofen (ADVIL) 800 MG tablet Take 1 tablet (800 mg total) by mouth 3 (three) times daily. 07/23/22  Yes Wave Calzada-Warren, Alda Lea, NP  norethindrone-ethinyl estradiol-FE (JUNEL FE 1/20) 1-20 MG-MCG tablet Take 1 tablet by mouth daily. 12/11/21  Yes Emily Filbert, MD    Family History Family History  Problem Relation Age of Onset   Cervical cancer Maternal Grandmother        not sure of age   Kidney disease Paternal Grandfather    Heart failure Paternal Grandfather    Breast cancer Neg Hx    Ovarian cancer Neg Hx     Social History Social History   Tobacco Use   Smoking status: Never   Smokeless tobacco: Never  Vaping Use   Vaping Use: Never used  Substance Use Topics   Alcohol use: Yes   Drug use: No     Allergies   Patient has no known allergies.   Review of Systems Review of Systems Per HPI  Physical Exam Triage Vital Signs ED Triage Vitals [07/23/22 0817]  Enc Vitals Group     BP (!) 146/90     Pulse Rate 94     Resp 18     Temp 98.7 F (37.1 C)     Temp Source Oral     SpO2 97 %     Weight  Height      Head Circumference      Peak Flow      Pain Score 9     Pain Loc      Pain Edu?      Excl. in [place]?    No data found.  Updated Vital Signs BP (!) 146/90 (BP Location: Right Arm)   Pulse 94   Temp 98.7 F (37.1 C) (Oral)   Resp 18   LMP 07/17/2022 (Exact Date)   SpO2 97%   Visual Acuity Right Eye Distance:   Left Eye Distance:   Bilateral Distance:    Right Eye Near:   Left Eye Near:    Bilateral Near:     Physical Exam Vitals and nursing note reviewed.  Constitutional:      General: She is not in acute distress.    Appearance: Normal appearance.  HENT:     Head: Normocephalic.  Eyes:     Pupils: Pupils are equal, round, and reactive to light.  Pulmonary:     Effort: Pulmonary effort is normal.  Musculoskeletal:     Cervical back: Normal range of motion.     Right knee: No swelling, deformity, erythema or ecchymosis. Decreased range of motion. Tenderness present. Normal pulse.     Comments: Tenderness noted to the lateral aspect of the right patella.  No tenderness noted along the medial, lateral, or posterior collateral ligaments.  There is no obvious swelling, deformity,  bruising, or ecchymosis present.  Skin:    General: Skin is warm and dry.  Neurological:     General: No focal deficit present.     Mental Status: She is alert and oriented to person, place, and time.  Psychiatric:        Mood and Affect: Mood normal.        Behavior: Behavior normal.      UC Treatments / Results  Labs (all labs ordered are listed, but only abnormal results are displayed) Labs Reviewed - No data to display  EKG   Radiology DG Knee Complete 4 Views Right  Result Date: 07/23/2022 CLINICAL DATA:  Right knee pain. EXAM: RIGHT KNEE - COMPLETE 4+ VIEW COMPARISON:  None Available. FINDINGS: No evidence of fracture, dislocation, or joint effusion. No evidence of arthropathy or other focal bone abnormality. Soft tissues are unremarkable. IMPRESSION: Negative. Electronically Signed   By: Kerby Moors M.D.   On: 07/23/2022 08:43    Procedures Procedures (including critical care time)  Medications Ordered in UC Medications - No data to display  Initial Impression / Assessment and Plan / UC Course  I have reviewed the triage vital signs and the nursing notes.  Pertinent labs & imaging results that were available during my care of the patient were reviewed by me and considered in my medical decision making (see chart for details).  The patient is well-appearing, she is in no acute distress, vital signs are stable.  Patient with complaints of right knee pain has been present for the past several months.  Right knee pain has worsened over the last 2 weeks.  X-rays are negative for obvious fracture or dislocation.  Difficult to ascertain the cause of the patient's knee pain at this time.  Will treat patient for right knee pain with ibuprofen 800 mg, and provide a knee brace to provide compression and support.  Supportive care recommendations were provided to the patient to include RICE therapy.  Because patient's pain continues and has been present for the past  several  months, recommend she follow-up with orthopedics for further evaluation.  Patient was given information for [facility] and for [facility].  Patient verbalizes understanding and is in agreement with this plan of care.  All questions were answered.  Patient stable for discharge.  Final Clinical Impressions(s) / UC Diagnoses   Final diagnoses:  Right knee pain, unspecified chronicity     Discharge Instructions      The x-ray of your knee was negative for fracture or dislocation.  No other abnormal findings were present on the x-ray. Take medication as prescribed.  Take medication with food and water. Use the knee brace to provide compression and support.  Make sure you are wearing the knee brace with strenuous or prolonged activity. RICE therapy, rest, ice, compression, and elevation.  When applying ice, apply for 20 minutes, remove for 1 hour, then repeat is much as possible. Gentle stretching and range of motion exercises are encouraged. Recommend following up with orthopedics for further evaluation.  He can follow-up with [facility] [place] at [phone] or with [facility] at [phone]. Follow-up as needed.     ED Prescriptions     Medication Sig Dispense Auth. Provider   ibuprofen (ADVIL) 800 MG tablet Take 1 tablet (800 mg total) by mouth 3 (three) times daily. 21 tablet Lewis Keats-Warren, Alda Lea, NP      PDMP not reviewed this encounter.   Tish Men, NP 07/23/22 [phone]

## 2022-07-26 DIAGNOSIS — M25561 Pain in right knee: Secondary | ICD-10-CM | POA: Diagnosis not present

## 2022-07-30 DIAGNOSIS — M25561 Pain in right knee: Secondary | ICD-10-CM | POA: Diagnosis not present

## 2022-08-20 ENCOUNTER — Ambulatory Visit
Admission: EM | Admit: 2022-08-20 | Discharge: 2022-08-20 | Disposition: A | Discharge status: Home / Self Care | Payer: 59 | Attending: Nurse Practitioner | Admitting: Nurse Practitioner

## 2022-08-20 DIAGNOSIS — J029 Acute pharyngitis, unspecified: Secondary | ICD-10-CM | POA: Diagnosis not present

## 2022-08-20 LAB — POCT RAPID STREP A (OFFICE): Rapid Strep A Screen: NEGATIVE

## 2022-08-20 NOTE — ED Provider Notes (Signed)
[facility]    CSN: [id number] Arrival date & time: 08/20/22  0800      History   Chief Complaint Chief Complaint  Patient presents with   Sore Throat    HPI Lynn Rice is a 27 y.o. female.   The history is provided by the patient.   The patient presents for complaints of sore throat that started over the past 2 days.  Patient states symptoms worsened over the past 24 hours.  She denies fever, chills, headache, ear drainage, nasal congestion, runny nose, cough, abdominal pain, nausea, vomiting, or diarrhea.  Patient states that she became concerned because one of her children and her husband were diagnosed with strep over the past several weeks. Past Medical History:  Diagnosis Date   No known health problems     There are no problems to display for this patient.   Past Surgical History:  Procedure Laterality Date   TONSILLECTOMY      OB History     Gravida  2   Para  2   Term  2   Preterm      AB      Living  2      SAB      IAB      Ectopic      Multiple  0   Live Births  2            Home Medications    Prior to Admission medications   Medication Sig Start Date End Date Taking? Authorizing Provider  ibuprofen (ADVIL) 800 MG tablet Take 1 tablet (800 mg total) by mouth 3 (three) times daily. 07/23/22   Keelan Tripodi-Warren, Alda Lea, NP  norethindrone-ethinyl estradiol-FE (JUNEL FE 1/20) 1-20 MG-MCG tablet Take 1 tablet by mouth daily. 12/11/21   Emily Filbert, MD    Family History Family History  Problem Relation Age of Onset   Cervical cancer Maternal Grandmother        not sure of age   Kidney disease Paternal Grandfather    Heart failure Paternal Grandfather    Breast cancer Neg Hx    Ovarian cancer Neg Hx     Social History Social History   Tobacco Use   Smoking status: Never   Smokeless tobacco: Never  Vaping Use   Vaping Use: Never used  Substance Use Topics   Alcohol use: Yes   Drug use: No      Allergies   Patient has no known allergies.   Review of Systems Review of Systems Per HPI  Physical Exam Triage Vital Signs ED Triage Vitals  Enc Vitals Group     BP 08/20/22 0818 121/82     Pulse --      Resp 08/20/22 0818 20     Temp 08/20/22 0818 98.2 F (36.8 C)     Temp Source 08/20/22 0818 Oral     SpO2 08/20/22 0818 99 %     Weight --      Height --      Head Circumference --      Peak Flow --      Pain Score 08/20/22 0819 6     Pain Loc --      Pain Edu? --      Excl. in [place]? --    No data found.  Updated Vital Signs BP 121/82 (BP Location: Right Arm)   Temp 98.2 F (36.8 C) (Oral)   Resp 20   LMP 08/15/2022 (Exact  Date)   SpO2 99%   Visual Acuity Right Eye Distance:   Left Eye Distance:   Bilateral Distance:    Right Eye Near:   Left Eye Near:    Bilateral Near:     Physical Exam Vitals and nursing note reviewed.  Constitutional:      General: She is not in acute distress.    Appearance: She is well-developed.  HENT:     Head: Normocephalic.     Right Ear: Tympanic membrane and ear canal normal.     Left Ear: Tympanic membrane and ear canal normal.     Nose: No congestion.     Mouth/Throat:     Pharynx: Posterior oropharyngeal erythema present. No pharyngeal swelling.     Tonsils: No tonsillar exudate. 0 on the right. 0 on the left.  Eyes:     Conjunctiva/sclera: Conjunctivae normal.     Pupils: Pupils are equal, round, and reactive to light.  Cardiovascular:     Rate and Rhythm: Normal rate and regular rhythm.     Heart sounds: Normal heart sounds.  Pulmonary:     Effort: Pulmonary effort is normal. No respiratory distress.     Breath sounds: Normal breath sounds. No stridor. No wheezing, rhonchi or rales.  Abdominal:     General: Bowel sounds are normal.     Palpations: Abdomen is soft.     Tenderness: There is no abdominal tenderness.  Musculoskeletal:     Cervical back: Normal range of motion.  Lymphadenopathy:      Cervical: No cervical adenopathy.  Skin:    General: Skin is warm and dry.  Neurological:     General: No focal deficit present.     Mental Status: She is alert and oriented to person, place, and time.  Psychiatric:        Mood and Affect: Mood normal.        Behavior: Behavior normal.      UC Treatments / Results  Labs (all labs ordered are listed, but only abnormal results are displayed) Labs Reviewed  CULTURE, GROUP A STREP [facility])  POCT RAPID STREP A (OFFICE)    EKG   Radiology No results found.  Procedures Procedures (including critical care time)  Medications Ordered in UC Medications - No data to display  Initial Impression / Assessment and Plan / UC Course  I have reviewed the triage vital signs and the nursing notes.  Pertinent labs & imaging results that were available during my care of the patient were reviewed by me and considered in my medical decision making (see chart for details).  The patient is well-appearing, she is in no acute distress, vital signs are stable.  Rapid strep test was negative.  Throat culture is pending.  Patient declined further viral testing.  Symptoms appear consistent with a viral pharyngitis.  Symptomatic treatment was recommended to include over-the-counter analgesics for pain or discomfort, warm salt water gargles, and a soft diet.  Patient was advised that if symptoms do not improve within the next several days, recommend that she follow-up in this clinic or with her primary care physician.  Patient is in agreement with this plan of care and verbalizes understanding.  All questions were answered.  Patient stable for discharge.  Final Clinical Impressions(s) / UC Diagnoses   Final diagnoses:  Sore throat     Discharge Instructions      The rapid strep test was negative.  A throat culture is pending.  You will be contacted  if the pending test results are positive. May take over-the-counter Tylenol or ibuprofen as needed for  pain, fever, general discomfort. Warm salt water gargles 3-4 times daily while throat pain persist. Recommend a soft diet such as soup, broth, yogurt, pudding, or Jell-O while symptoms persist. If symptoms do not improve within the next several days, or if they suddenly worsen, recommend following up in this clinic or with your primary care physician for further evaluation. Follow-up as needed.     ED Prescriptions   None    PDMP not reviewed this encounter.   Tish Men, NP 08/20/22 1019

## 2022-08-20 NOTE — ED Triage Notes (Signed)
Pt reports she has throat pain that makes it hard to swallow x 2 days. Took ibuprofen and nyquil which gave slight relief.

## 2022-08-20 NOTE — Discharge Instructions (Signed)
The rapid strep test was negative.  A throat culture is pending.  You will be contacted if the pending test results are positive. May take over-the-counter Tylenol or ibuprofen as needed for pain, fever, general discomfort. Warm salt water gargles 3-4 times daily while throat pain persist. Recommend a soft diet such as soup, broth, yogurt, pudding, or Jell-O while symptoms persist. If symptoms do not improve within the next several days, or if they suddenly worsen, recommend following up in this clinic or with your primary care physician for further evaluation. Follow-up as needed.

## 2022-08-21 LAB — CULTURE, GROUP A STREP (THRC)

## 2022-08-23 LAB — CULTURE, GROUP A STREP (THRC)

## 2022-08-30 ENCOUNTER — Ambulatory Visit (INDEPENDENT_AMBULATORY_CARE_PROVIDER_SITE_OTHER): Payer: 59

## 2022-08-30 ENCOUNTER — Telehealth: Payer: Self-pay

## 2022-08-30 VITALS — BP 113/80 | HR 83 | Temp 98.6°F | Resp 16 | Ht 65.0 in | Wt 135.1 lb

## 2022-08-30 DIAGNOSIS — R300 Dysuria: Secondary | ICD-10-CM | POA: Diagnosis not present

## 2022-08-30 DIAGNOSIS — R350 Frequency of micturition: Secondary | ICD-10-CM | POA: Diagnosis not present

## 2022-08-30 LAB — POCT URINALYSIS DIPSTICK
Bilirubin, UA: POSITIVE
Glucose, UA: NEGATIVE
Ketones, UA: NEGATIVE
Nitrite, UA: POSITIVE
Protein, UA: POSITIVE — AB
Spec Grav, UA: 1.015 (ref 1.010–1.025)
Urobilinogen, UA: 0.2 E.U./dL
pH, UA: 7 (ref 5.0–8.0)

## 2022-08-30 MED ORDER — NITROFURANTOIN MONOHYD MACRO 100 MG PO CAPS: 100.0000 mg | ORAL_CAPSULE | Freq: Two times a day (BID) | ORAL | 0 refills | Status: DC

## 2022-08-30 NOTE — Progress Notes (Signed)
    NURSE VISIT NOTE  Subjective:    Patient ID: Lynn Rice, female    DOB: 1996/01/13, 27 y.o.   MRN: [id number]       HPI  Patient is a 27 y.o. [id number] female who presents for dysuria and urinary frequency for 1 day.  Patient denies dysuria, urinary frequency, urinary urgency, and lower back pain.  Patient does not have a history of recurrent UTI. She had a lot of kidney infections as a child.    Objective:    BP 113/80   Pulse 83   Temp 98.6 F (37 C)   Resp 16   Ht 5\' 5"  (1.651 m)   Wt 135 lb 1.6 oz (61.3 kg)   LMP 08/15/2022 (Exact Date)   BMI 22.48 kg/m    Lab Review  Results for orders placed or performed in visit on 08/30/22  POCT Urinalysis Dipstick  Result Value Ref Range   Color, UA     Clarity, UA     Glucose, UA Negative Negative   Bilirubin, UA Positive    Ketones, UA Negative    Spec Grav, UA 1.015 1.010 - 1.025   Blood, UA Large    pH, UA 7.0 5.0 - 8.0   Protein, UA Positive (A) Negative   Urobilinogen, UA 0.2 0.2 or 1.0 E.U./dL   Nitrite, UA Positive    Leukocytes, UA Small (1+) (A) Negative   Appearance     Odor      Assessment:   1. Dysuria   2. Urinary frequency      Plan:   Urine Culture Sent. Treatment  Macrobid 100 mg PO BID for 7 days. Maintain adequate hydration.  Follow up if symptoms worsen or fail to improve as anticipated, and as needed.     Chilton Greathouse, [facility]

## 2022-08-30 NOTE — Telephone Encounter (Signed)
Pt called triage reporting uti symptoms. Dysuria, frequency. Pt added to nurse schedule for 1:30.

## 2022-08-30 NOTE — Patient Instructions (Signed)
Urinary Tract Infection, Adult A urinary tract infection (UTI) is an infection of any part of the urinary tract. The urinary tract includes: The kidneys. The ureters. The bladder. The urethra. These organs make, store, and get rid of pee (urine) in the body. What are the causes? This infection is caused by germs (bacteria) in your genital area. These germs grow and cause swelling (inflammation) of your urinary tract. What increases the risk? The following factors may make you more likely to develop this condition: Using a small, thin tube (catheter) to drain pee. Not being able to control when you pee or poop (incontinence). Being female. If you are female, these things can increase the risk: Using these methods to prevent pregnancy: A medicine that kills sperm (spermicide). A device that blocks sperm (diaphragm). Having low levels of a female hormone (estrogen). Being pregnant. You are more likely to develop this condition if: You have genes that add to your risk. You are sexually active. You take antibiotic medicines. You have trouble peeing because of: A prostate that is bigger than normal, if you are female. A blockage in the part of your body that drains pee from the bladder. A kidney stone. A nerve condition that affects your bladder. Not getting enough to drink. Not peeing often enough. You have other conditions, such as: Diabetes. A weak disease-fighting system (immune system). Sickle cell disease. Gout. Injury of the spine. What are the signs or symptoms? Symptoms of this condition include: Needing to pee right away. Peeing small amounts often. Pain or burning when peeing. Blood in the pee. Pee that smells bad or not like normal. Trouble peeing. Pee that is cloudy. Fluid coming from the vagina, if you are female. Pain in the belly or lower back. Other symptoms include: Vomiting. Not feeling hungry. Feeling mixed up (confused). This may be the first symptom in  older adults. Being tired and grouchy (irritable). A fever. Watery poop (diarrhea). How is this treated? Taking antibiotic medicine. Taking other medicines. Drinking enough water. In some cases, you may need to see a specialist. Follow these instructions at home:  Medicines Take over-the-counter and prescription medicines only as told by your doctor. If you were prescribed an antibiotic medicine, take it as told by your doctor. Do not stop taking it even if you start to feel better. General instructions Make sure you: Pee until your bladder is empty. Do not hold pee for a long time. Empty your bladder after sex. Wipe from front to back after peeing or pooping if you are a female. Use each tissue one time when you wipe. Drink enough fluid to keep your pee pale yellow. Keep all follow-up visits. Contact a doctor if: You do not get better after 1-2 days. Your symptoms go away and then come back. Get help right away if: You have very bad back pain. You have very bad pain in your lower belly. You have a fever. You have chills. You feeling like you will vomit or you vomit. Summary A urinary tract infection (UTI) is an infection of any part of the urinary tract. This condition is caused by germs in your genital area. There are many risk factors for a UTI. Treatment includes antibiotic medicines. Drink enough fluid to keep your pee pale yellow. This information is not intended to replace advice given to you by your health care provider. Make sure you discuss any questions you have with your health care provider. Document Revised: 12/25/2019 Document Reviewed: 12/25/2019 Elsevier Patient Education    2023 Elsevier Inc.  

## 2022-09-04 LAB — URINE CULTURE

## 2022-12-17 ENCOUNTER — Ambulatory Visit
Admission: EM | Admit: 2022-12-17 | Discharge: 2022-12-17 | Disposition: A | Discharge status: Home / Self Care | Payer: 59 | Attending: Family Medicine | Admitting: Family Medicine

## 2022-12-17 DIAGNOSIS — J029 Acute pharyngitis, unspecified: Secondary | ICD-10-CM | POA: Diagnosis not present

## 2022-12-17 LAB — POCT RAPID STREP A (OFFICE): Rapid Strep A Screen: NEGATIVE

## 2022-12-17 MED ORDER — LIDOCAINE VISCOUS HCL 2 % MT SOLN: 10.0000 mL | OROMUCOSAL | 0 refills | Status: DC | PRN

## 2022-12-17 NOTE — ED Provider Notes (Signed)
[facility]    CSN: [id number] Arrival date & time: 12/17/22  4696      History   Chief Complaint No chief complaint on file.   HPI Lynn Rice is a 27 y.o. female.   Presenting today with 1 day history of significant sore throat with an ulcer on the right side and now right ear pain and throbbing.  Denies fever, chills, cough, congestion, abdominal pain, nausea vomiting or diarrhea.  Taking ibuprofen with minimal relief of symptoms.  History of tonsillectomy.    Past Medical History:  Diagnosis Date   No known health problems     There are no problems to display for this patient.   Past Surgical History:  Procedure Laterality Date   TONSILLECTOMY      OB History     Gravida  2   Para  2   Term  2   Preterm      AB      Living  2      SAB      IAB      Ectopic      Multiple  0   Live Births  2            Home Medications    Prior to Admission medications   Medication Sig Start Date End Date Taking? Authorizing Provider  lidocaine (XYLOCAINE) 2 % solution Use as directed 10 mLs in the mouth or throat every 3 (three) hours as needed. 12/17/22  Yes Particia Nearing, PA-C  ibuprofen (ADVIL) 800 MG tablet Take 1 tablet (800 mg total) by mouth 3 (three) times daily. 07/23/22   Leath-Warren, Sadie Haber, NP  nitrofurantoin, macrocrystal-monohydrate, (MACROBID) 100 MG capsule Take 1 capsule (100 mg total) by mouth 2 (two) times daily. 08/30/22   Copland, Ilona Sorrel, PA-C  norethindrone-ethinyl estradiol-FE (JUNEL FE 1/20) 1-20 MG-MCG tablet Take 1 tablet by mouth daily. 12/11/21   Allie Bossier, MD    Family History Family History  Problem Relation Age of Onset   Cervical cancer Maternal Grandmother        not sure of age   Kidney disease Paternal Grandfather    Heart failure Paternal Grandfather    Breast cancer Neg Hx    Ovarian cancer Neg Hx     Social History Social History   Tobacco Use   Smoking status: Never    Smokeless tobacco: Never  Vaping Use   Vaping status: Never Used  Substance Use Topics   Alcohol use: Yes   Drug use: No     Allergies   Patient has no known allergies.   Review of Systems Review of Systems PER HPI  Physical Exam Triage Vital Signs ED Triage Vitals  Encounter Vitals Group     BP 12/17/22 1044 120/77     Systolic BP Percentile --      Diastolic BP Percentile --      Pulse Rate 12/17/22 1044 73     Resp 12/17/22 1044 18     Temp 12/17/22 1044 98.2 F (36.8 C)     Temp Source 12/17/22 1044 Oral     SpO2 12/17/22 1044 99 %     Weight --      Height --      Head Circumference --      Peak Flow --      Pain Score 12/17/22 1045 7     Pain Loc --      Pain Education --  Exclude from Growth Chart --    No data found.  Updated Vital Signs BP 120/77 (BP Location: Left Arm)   Pulse 73   Temp 98.2 F (36.8 C) (Oral)   Resp 18   LMP 12/03/2022 Comment: start  SpO2 99%   Visual Acuity Right Eye Distance:   Left Eye Distance:   Bilateral Distance:    Right Eye Near:   Left Eye Near:    Bilateral Near:     Physical Exam Vitals and nursing note reviewed.  Constitutional:      Appearance: Normal appearance. She is not ill-appearing.  HENT:     Head: Atraumatic.     Right Ear: Tympanic membrane, ear canal and external ear normal.     Left Ear: Tympanic membrane, ear canal and external ear normal.     Nose: Nose normal.     Mouth/Throat:     Mouth: Mucous membranes are moist.     Pharynx: Oropharynx is clear. Posterior oropharyngeal erythema present. No oropharyngeal exudate.     Comments: Right sided oropharyngeal erythema and one area of ulceration, no exudates, tonsil stones or edema noted. Uvula midline, oral airway patent Eyes:     Extraocular Movements: Extraocular movements intact.     Conjunctiva/sclera: Conjunctivae normal.  Cardiovascular:     Rate and Rhythm: Normal rate and regular rhythm.     Heart sounds: Normal heart sounds.   Pulmonary:     Effort: Pulmonary effort is normal.     Breath sounds: Normal breath sounds.  Musculoskeletal:        General: Normal range of motion.     Cervical back: Normal range of motion and neck supple.  Skin:    General: Skin is warm and dry.  Neurological:     Mental Status: She is alert and oriented to person, place, and time.  Psychiatric:        Mood and Affect: Mood normal.        Thought Content: Thought content normal.        Judgment: Judgment normal.      UC Treatments / Results  Labs (all labs ordered are listed, but only abnormal results are displayed) Labs Reviewed  CULTURE, GROUP A STREP [facility])  POCT RAPID STREP A (OFFICE)    EKG   Radiology No results found.  Procedures Procedures (including critical care time)  Medications Ordered in UC Medications - No data to display  Initial Impression / Assessment and Plan / UC Course  I have reviewed the triage vital signs and the nursing notes.  Pertinent labs & imaging results that were available during my care of the patient were reviewed by me and considered in my medical decision making (see chart for details).     Vitals and exam reassuring, rapid strep negative, throat culture pending.  Suspect viral.  Treat with viscous lidocaine, salt water gargles, ibuprofen and Tylenol.  Adjust if needed based on throat culture.  Final Clinical Impressions(s) / UC Diagnoses   Final diagnoses:  Acute pharyngitis, unspecified etiology     Discharge Instructions      Take ibuprofen for pain and inflammation, may gargle with salt water and either dab or gargle the lidocaine solution for numbing.  We will let you know if your throat culture is positive and we need to change course.    ED Prescriptions     Medication Sig Dispense Auth. Provider   lidocaine (XYLOCAINE) 2 % solution Use as directed 10 mLs in the  mouth or throat every 3 (three) hours as needed. 100 mL Particia Nearing, [place]       PDMP not reviewed this encounter.   Particia Nearing, [place] 12/17/22 1117

## 2022-12-17 NOTE — ED Triage Notes (Signed)
Pt reports she has an ulcer on the back of her throat that is painful x 1 day. States her right ear is throbbing.

## 2022-12-17 NOTE — Discharge Instructions (Signed)
Take ibuprofen for pain and inflammation, may gargle with salt water and either dab or gargle the lidocaine solution for numbing.  We will let you know if your throat culture is positive and we need to change course.

## 2022-12-18 LAB — CULTURE, GROUP A STREP (THRC)

## 2022-12-19 ENCOUNTER — Telehealth: Payer: Self-pay | Admitting: Emergency Medicine

## 2022-12-19 LAB — CULTURE, GROUP A STREP (THRC)

## 2022-12-19 NOTE — Telephone Encounter (Signed)
Pt inquiring about continued throat pain. Discussed throat lozenges, otc throat sprays, and warm liquids until throat culture comes back. Pt aware if throat culture comes back negative and still has pain that pt will likely need to be re-evaluated. Pt verbalized understanding.

## 2023-01-07 NOTE — Progress Notes (Unsigned)
   PCP:  Babs Sciara, MD   No chief complaint on file.    HPI:      Ms. Lynn Rice is a 27 y.o. [id number] whose LMP was Patient's last menstrual period was 12/03/2022., presents today for her annual examination.  Her menses are {norm/abn:715}, lasting {number: 22536} days.  Dysmenorrhea {dysmen:716}. She {does:18564} have intermenstrual bleeding.  Sex activity: {sex active: 315163}.  Last Pap: 10/21/19 Results were: no abnormalities  Hx of STDs: {STD hx:14358}  There is no FH of breast cancer. There is no FH of ovarian cancer. The patient {does:18564} do self-breast exams.  Tobacco use: {tob:20664} Alcohol use: {Alcohol:11675} No drug use.  Exercise: {exercise:31265}  She {does:18564} get adequate calcium and Vitamin D in her diet.  There are no problems to display for this patient.   Past Surgical History:  Procedure Laterality Date   TONSILLECTOMY      Family History  Problem Relation Age of Onset   Cervical cancer Maternal Grandmother        not sure of age   Kidney disease Paternal Grandfather    Heart failure Paternal Grandfather    Breast cancer Neg Hx    Ovarian cancer Neg Hx     Social History   Socioeconomic History   Marital status: Married    Spouse name: Not on file   Number of children: Not on file   Years of education: Not on file   Highest education level: Not on file  Occupational History   Not on file  Tobacco Use   Smoking status: Never   Smokeless tobacco: Never  Vaping Use   Vaping status: Never Used  Substance and Sexual Activity   Alcohol use: Yes   Drug use: No   Sexual activity: Yes    Partners: Male    Birth control/protection: Pill  Other Topics Concern   Not on file  Social History Narrative   Not on file   Social Determinants of Health   Financial Resource Strain: Not on file  Food Insecurity: Not on file  Transportation Needs: Not on file  Physical Activity: Not on file  Stress: Not on file  Social  Connections: Not on file  Intimate Partner Violence: Not on file     Current Outpatient Medications:    ibuprofen (ADVIL) 800 MG tablet, Take 1 tablet (800 mg total) by mouth 3 (three) times daily., Disp: 21 tablet, Rfl: 0   lidocaine (XYLOCAINE) 2 % solution, Use as directed 10 mLs in the mouth or throat every 3 (three) hours as needed., Disp: 100 mL, Rfl: 0   nitrofurantoin, macrocrystal-monohydrate, (MACROBID) 100 MG capsule, Take 1 capsule (100 mg total) by mouth 2 (two) times daily., Disp: 14 capsule, Rfl: 0   norethindrone-ethinyl estradiol-FE (JUNEL FE 1/20) 1-20 MG-MCG tablet, Take 1 tablet by mouth daily., Disp: 84 tablet, Rfl: 5     ROS:  Review of Systems BREAST: No symptoms   Objective: LMP 12/03/2022 Comment: start   OBGyn Exam  Results: No results found for this or any previous visit (from the past 24 hour(s)).  Assessment/Plan: No diagnosis found.  No orders of the defined types were placed in this encounter.            GYN counsel {counseling: 16159}     F/U  No follow-ups on file.  Lynn Jeancharles B. Conan Mcmanaway, PA-C 01/07/2023 4:39 PM

## 2023-01-08 ENCOUNTER — Encounter: Payer: Self-pay | Admitting: Obstetrics and Gynecology

## 2023-01-08 ENCOUNTER — Other Ambulatory Visit (HOSPITAL_COMMUNITY): Admission: RE | Admit: 2023-01-08 | Payer: 59 | Source: Ambulatory Visit

## 2023-01-08 ENCOUNTER — Ambulatory Visit (INDEPENDENT_AMBULATORY_CARE_PROVIDER_SITE_OTHER): Payer: 59 | Admitting: Obstetrics and Gynecology

## 2023-01-08 VITALS — BP 102/70 | Ht 65.0 in | Wt 145.0 lb

## 2023-01-08 DIAGNOSIS — Z124 Encounter for screening for malignant neoplasm of cervix: Secondary | ICD-10-CM | POA: Diagnosis not present

## 2023-01-08 DIAGNOSIS — Z3041 Encounter for surveillance of contraceptive pills: Secondary | ICD-10-CM

## 2023-01-08 DIAGNOSIS — Z01419 Encounter for gynecological examination (general) (routine) without abnormal findings: Secondary | ICD-10-CM

## 2023-01-08 DIAGNOSIS — L710 Perioral dermatitis: Secondary | ICD-10-CM

## 2023-01-08 DIAGNOSIS — F419 Anxiety disorder, unspecified: Secondary | ICD-10-CM

## 2023-01-08 MED ORDER — ERYTHROMYCIN 2 % EX GEL
Freq: Two times a day (BID) | CUTANEOUS | 0 refills | Status: AC
Start: 2023-01-08 — End: ?

## 2023-01-08 MED ORDER — HYDROXYZINE HCL 25 MG PO TABS: 25.0000 mg | ORAL_TABLET | Freq: Four times a day (QID) | ORAL | 0 refills | Status: DC | PRN

## 2023-01-08 NOTE — Patient Instructions (Signed)
I value your feedback and you entrusting us with your care. If you get a [facility] patient survey, I would appreciate you taking the time to let us know about your experience today. Thank you! ? ? ?

## 2023-01-21 ENCOUNTER — Other Ambulatory Visit (HOSPITAL_COMMUNITY)
Admission: RE | Admit: 2023-01-21 | Discharge: 2023-01-21 | Disposition: A | Discharge status: Home / Self Care | Payer: 59 | Source: Ambulatory Visit

## 2023-01-21 ENCOUNTER — Ambulatory Visit (INDEPENDENT_AMBULATORY_CARE_PROVIDER_SITE_OTHER): Payer: 59

## 2023-01-21 ENCOUNTER — Encounter: Payer: Self-pay | Admitting: Obstetrics and Gynecology

## 2023-01-21 VITALS — BP 128/88 | HR 83 | Ht 65.0 in | Wt 144.0 lb

## 2023-01-21 DIAGNOSIS — N898 Other specified noninflammatory disorders of vagina: Secondary | ICD-10-CM | POA: Insufficient documentation

## 2023-01-21 NOTE — Progress Notes (Signed)
    NURSE VISIT NOTE  Subjective:    Patient ID: Lynn Rice, female    DOB: 1995-06-25, 27 y.o.   MRN: [id number]  HPI  Patient is a 27 y.o. G64P2002 female who presents for white and thick vaginal discharge, irritation and vaginal itching for 3 day(s). Denies abnormal vaginal bleeding or significant pelvic pain or fever. denies UTI symptoms. Patient denies history of known exposure to STD.   Objective:    BP 128/88   Pulse 83   Ht 5\' 5"  (1.651 m)   Wt 144 lb (65.3 kg)   LMP 01/01/2023 (Approximate)   BMI 23.96 kg/m    Assessment:   1. Vaginal itching   2. Vaginal discharge   3. Vaginal irritation       Plan:   GC and chlamydia DNA  probe sent to lab. Treatment: OTC yeast cream such as Monistat or Gyne-Lotrimin and await lab results for further treatment.  ROV prn if symptoms persist or worsen.   Loman Chroman, CMA

## 2023-01-21 NOTE — Telephone Encounter (Signed)
The patient contacted the office. I have the patient scheduled for nurse for self swab at 3:15 pm.

## 2023-01-23 LAB — CERVICOVAGINAL ANCILLARY ONLY
Bacterial Vaginitis (gardnerella): NEGATIVE
Candida Glabrata: NEGATIVE
Candida Vaginitis: POSITIVE — AB
Comment: NEGATIVE
Comment: NEGATIVE
Comment: NEGATIVE

## 2023-01-24 ENCOUNTER — Telehealth: Payer: Self-pay

## 2023-01-24 ENCOUNTER — Other Ambulatory Visit: Payer: Self-pay

## 2023-01-24 DIAGNOSIS — B379 Candidiasis, unspecified: Secondary | ICD-10-CM

## 2023-01-24 MED ORDER — FLUCONAZOLE 150 MG PO TABS
150.0000 mg | ORAL_TABLET | Freq: Every day | ORAL | 0 refills | Status: AC
Start: 2023-01-24 — End: ?

## 2023-01-24 NOTE — Telephone Encounter (Signed)
Pt calling; has seen her results and needs rx sent in.  [phone]  Pt aware Diflucan sent in.

## 2023-02-22 ENCOUNTER — Other Ambulatory Visit: Payer: Self-pay | Admitting: Obstetrics and Gynecology

## 2023-02-22 DIAGNOSIS — F419 Anxiety disorder, unspecified: Secondary | ICD-10-CM

## 2023-02-24 MED ORDER — HYDROXYZINE HCL 25 MG PO TABS
25.0000 mg | ORAL_TABLET | Freq: Four times a day (QID) | ORAL | 2 refills | Status: AC | PRN
Start: 2023-02-24 — End: ?

## 2023-05-02 DIAGNOSIS — Z139 Encounter for screening, unspecified: Secondary | ICD-10-CM | POA: Diagnosis not present

## 2023-05-08 DIAGNOSIS — R946 Abnormal results of thyroid function studies: Secondary | ICD-10-CM | POA: Diagnosis not present

## 2023-05-31 ENCOUNTER — Other Ambulatory Visit: Payer: Self-pay | Admitting: Obstetrics and Gynecology

## 2023-05-31 DIAGNOSIS — L710 Perioral dermatitis: Secondary | ICD-10-CM

## 2023-08-14 ENCOUNTER — Other Ambulatory Visit: Payer: Self-pay

## 2023-08-14 ENCOUNTER — Ambulatory Visit (HOSPITAL_COMMUNITY): Payer: 59 | Attending: Nurse Practitioner

## 2023-08-14 ENCOUNTER — Encounter (HOSPITAL_COMMUNITY): Payer: Self-pay

## 2023-08-14 DIAGNOSIS — M542 Cervicalgia: Secondary | ICD-10-CM | POA: Insufficient documentation

## 2023-08-14 DIAGNOSIS — Z7409 Other reduced mobility: Secondary | ICD-10-CM | POA: Insufficient documentation

## 2023-08-14 NOTE — Therapy (Addendum)
 OUTPATIENT PHYSICAL THERAPY CERVICAL EVALUATION   Patient Name: Lynn Rice MRN: [id number] DOB:[birth year], 28 y.o., female Today's Date: 08/14/2023  END OF SESSION:  PT End of Session - 08/14/23 1343     Visit Number 1    Number of Visits 5    Date for PT Re-Evaluation 09/11/23    Authorization Type [employer]    Authorization Time Period no auth    PT Start Time 1345    PT Stop Time 1431    PT Time Calculation (min) 46 min    Activity Tolerance Patient tolerated treatment well    Behavior During Therapy WFL for tasks assessed/performed             Past Medical History:  Diagnosis Date   No known health problems    Past Surgical History:  Procedure Laterality Date   TONSILLECTOMY     There are no active problems to display for this patient.   PCP: Babs Sciara, MD  REFERRING PROVIDER: Micael Hampshire, FNP  REFERRING DIAG: M54.2 (ICD-10-CM) - Cervicalgia  THERAPY DIAG:  Neck pain on right side  Impaired functional mobility and activity tolerance  Cervicalgia  Rationale for Evaluation and Treatment: Rehabilitation  ONSET DATE: January 2025  SUBJECTIVE:                                                                                                                                                                                                         SUBJECTIVE STATEMENT: Patient reports very severe neck pain that began insidiously in January. That radiates down R arm. Pain is an ache most times but shooting pain sometimes. Pain also sometimes feels like a heaviness and numbness. Tried sterioids pills and steroid shots that helped some. Every few days a pain that feels like a bee sting comes on that lasts a second but goes away almost automatically. Tried a new pillow to sleep but it hasnt helped much. Sleeps on side mostly. Pain Wakes her 1-2 times a night. Pain occurs at mid cervical spine and Goes down into R shoulder to mid shoulder blade  area. Patient reported recent increase in anxiety medication dosage. Medication is not listed in medication list.   Hand dominance: Right  PERTINENT HISTORY:  N/A  PAIN:  Are you having pain? No  PRECAUTIONS: None  RED FLAGS: None     WEIGHT BEARING RESTRICTIONS: No  FALLS:  Has patient fallen in last 6 months? No   OCCUPATION: Pre school Development worker, community  PLOF: Independent.  In January she needed  helped with getting clothes on and had to get shower chair due to pain. No longer has to use it.  PATIENT GOALS: Wants to stop hurting.  NEXT MD VISIT: 6 months from now, 09/22  OBJECTIVE:  Note: Objective measures were completed at Evaluation unless otherwise noted.  DIAGNOSTIC FINDINGS:  N/A  PATIENT SURVEYS:  NDI: 9 / 50 = 18.0 %  COGNITION: Overall cognitive status: Within functional limits for tasks assessed  SENSATION: Not tested , Next session  POSTURE: No Significant postural limitations  PALPATION: TTP and tightness felt in R upper trap region, palpation recreates patient's familiar pain symptoms  TTP and stiffness in  C7-T1 area. CPA in this region recreates patient's familiar pain symptoms   CERVICAL ROM:   Active ROM A/PROM (deg) eval  Flexion 100% w/ achy pain  Extension 100 %  Right lateral flexion 100% w/ achy pain  Left lateral flexion 100%  Right rotation 100% recreates pain most  Left rotation 100%   (Blank rows = not tested)  UPPER EXTREMITY ROM:  Active ROM Right eval Left eval  Shoulder flexion [facility]  Shoulder extension    Shoulder abduction [facility]  Shoulder adduction    Shoulder extension    Shoulder internal rotation    Shoulder external rotation    Elbow flexion    Elbow extension    Wrist flexion    Wrist extension    Wrist ulnar deviation    Wrist radial deviation    Wrist pronation    Wrist supination     (Blank rows = not tested)  UPPER EXTREMITY MMT:  MMT Right eval Left eval  Shoulder  flexion 4-/5, ache pain  4-/5 recreates ache on R  Shoulder extension    Shoulder abduction 4-/5 ache pain  4-/5 recreates ache on R  Shoulder adduction    Shoulder extension    Shoulder internal rotation    Shoulder external rotation    Middle trapezius    Lower trapezius    Elbow flexion    Elbow extension    Wrist flexion    Wrist extension    Wrist ulnar deviation    Wrist radial deviation    Wrist pronation    Wrist supination    Grip strength     (Blank rows = not tested)  CERVICAL SPECIAL TESTS:  Distraction test: Negative , Does not improve or worsen symptoms.  FUNCTIONAL TESTS:    TREATMENT DATE:  08/14/2023: PT Evaluation and HEP given                                                                                                                                 PATIENT EDUCATION:  Education details: PT evaluation, objective findings, POC, Importance of HEP, Precautions, Clinic policies  Person educated: Patient Education method: Explanation and Demonstration Education comprehension: verbalized understanding and returned demonstration  HOME EXERCISE PROGRAM: Access Code: [email or web address] URL: [email or web address] Date: 08/14/2023 Prepared  by: Chryl Heck  Exercises - Supine Chin Tuck  - 2 x daily - 7 x weekly - 2 sets - 10 reps - 3 hold - Seated Upper Trapezius Stretch  - 2 x daily - 7 x weekly - 2 sets - 30 hold - Seated Assisted Cervical Rotation with Towel  - 1 x daily - 7 x weekly - 2 sets - 30 hold  ASSESSMENT:  CLINICAL IMPRESSION: Patient is a 28 y.o. female who was seen today for physical therapy evaluation and treatment for M54.2 (ICD-10-CM) - Cervicalgia. Patient presents to PT with reports of moderate neck pain that occasionally radiates down the R shoulder/arm. R lateral flexion, R rotation, and flexion are most provoking motions and reproduce her pain along with shoulder MMT. Patient demonstrates tightness throughout neck  musculature, R>L, and stiffness throughout cervical spine that may be contributing to her symptoms. Patient will benefit from skilled physical therapy in order to address the above to improve her pain and QOL.   OBJECTIVE IMPAIRMENTS: decreased mobility, decreased strength, impaired UE functional use, and pain.   ACTIVITY LIMITATIONS: carrying, lifting, dressing, and reach over head  PARTICIPATION LIMITATIONS: cleaning, driving, and occupation   REHAB POTENTIAL: Good  CLINICAL DECISION MAKING: Stable/uncomplicated  EVALUATION COMPLEXITY: Low   GOALS: Goals reviewed with patient? No  SHORT TERM GOALS: Target date: 08/28/23  Patient will be independent with performance of HEP to demonstrate adequate self management of symptoms.  Baseline:  Goal status: INITIAL  2.   Patient will report at least a 25% improvement overall since beginning PT. Baseline:  Goal status: INITIAL  LONG TERM GOALS: Target date: 09/11/23  Patient will improve NDI score by 9 points to demonstrate improved perceived function. Baseline: See above Goal status: INITIAL 2.  Patient will perform full cervical flexion, R rotation and R lateral flexion with no radiating symptoms to show improved neck mobility for safe performance of functional tasks.  Baseline: See above Goal status: INITIAL 3.  Patient will score at least a  4+/5 on UE MMT with reports of decreased pain to show increased UE strength for over head activities. Baseline: See above Goal status: INITIAL   PLAN:  PT FREQUENCY: 1-2x/week  PT DURATION: 4 weeks  PLANNED INTERVENTIONS: 97164- PT Re-evaluation, 97110-Therapeutic exercises, 97530- Therapeutic activity, 97112- Neuromuscular re-education, 97535- Self Care, 40981- Manual therapy, Patient/Family education, Dry Needling, and Moist heat  PLAN FOR NEXT SESSION: Progress mobility and strengthening, CPA/UPA to tolerance, Grip strength test, Spurling test, Neck muscle endurance testing   3:56  PM, 08/14/23 Elisama Thissen Powell-Butler, PT, DPT [facility] [place]

## 2023-08-21 ENCOUNTER — Encounter (HOSPITAL_COMMUNITY): Payer: Self-pay

## 2023-08-21 ENCOUNTER — Ambulatory Visit (HOSPITAL_COMMUNITY)

## 2023-08-21 DIAGNOSIS — Z7409 Other reduced mobility: Secondary | ICD-10-CM

## 2023-08-21 DIAGNOSIS — M542 Cervicalgia: Secondary | ICD-10-CM

## 2023-08-21 NOTE — Therapy (Signed)
 OUTPATIENT PHYSICAL THERAPY CERVICAL EVALUATION   Patient Name: Lynn Rice MRN: [id number] DOB:Jan 05, 1996, 28 y.o., female Today's Date: 08/21/2023  END OF SESSION:  PT End of Session - 08/21/23 1440     Visit Number 2    Number of Visits 5    Date for PT Re-Evaluation 09/11/23    Authorization Type [employer]    Authorization Time Period no auth    PT Start Time 1304    PT Stop Time 1345    PT Time Calculation (min) 41 min    Activity Tolerance Patient tolerated treatment well    Behavior During Therapy WFL for tasks assessed/performed              Past Medical History:  Diagnosis Date   No known health problems    Past Surgical History:  Procedure Laterality Date   TONSILLECTOMY     There are no active problems to display for this patient.   PCP: Babs Sciara, MD  REFERRING PROVIDER: Micael Hampshire, FNP  REFERRING DIAG: M54.2 (ICD-10-CM) - Cervicalgia  THERAPY DIAG:  Neck pain on right side  Impaired functional mobility and activity tolerance  Cervicalgia  Rationale for Evaluation and Treatment: Rehabilitation  ONSET DATE: January 2025  SUBJECTIVE:                                                                                                                                                                                                         SUBJECTIVE STATEMENT: Pt reports a lot of soreness due to getting her poole ready. Currently 1/10.   Patient reports very severe neck pain that began insidiously in January. That radiates down R arm. Pain is an ache most times but shooting pain sometimes. Pain also sometimes feels like a heaviness and numbness. Tried sterioids pills and steroid shots that helped some. Every few days a pain that feels like a bee sting comes on that lasts a second but goes away almost automatically. Tried a new pillow to sleep but it hasnt helped much. Sleeps on side mostly. Pain Wakes her 1-2 times a night.  Pain occurs at mid cervical spine and Goes down into R shoulder to mid shoulder blade area. Patient reported recent increase in anxiety medication dosage. Medication is not listed in medication list.   Hand dominance: Right  PERTINENT HISTORY:  N/A  PAIN:  Are you having pain? No  PRECAUTIONS: None  RED FLAGS: None     WEIGHT BEARING RESTRICTIONS: No  FALLS:  Has patient fallen in last 6 months? No  OCCUPATION: Pre school Development worker, community  PLOF: Independent.  In January she needed helped with getting clothes on and had to get shower chair due to pain. No longer has to use it.  PATIENT GOALS: Wants to stop hurting.  NEXT MD VISIT: 6 months from now, 09/22  OBJECTIVE:  Note: Objective measures were completed at Evaluation unless otherwise noted.  DIAGNOSTIC FINDINGS:  N/A  PATIENT SURVEYS:  NDI: 9 / 50 = 18.0 %  COGNITION: Overall cognitive status: Within functional limits for tasks assessed  SENSATION: Not tested , Next session  POSTURE: No Significant postural limitations  PALPATION: TTP and tightness felt in R upper trap region, palpation recreates patient's familiar pain symptoms  TTP and stiffness in  C7-T1 area. CPA in this region recreates patient's familiar pain symptoms   CERVICAL ROM:   Active ROM A/PROM (deg) eval  Flexion 100% w/ achy pain  Extension 100 %  Right lateral flexion 100% w/ achy pain  Left lateral flexion 100%  Right rotation 100% recreates pain most  Left rotation 100%   (Blank rows = not tested)  UPPER EXTREMITY ROM:  Active ROM Right eval Left eval  Shoulder flexion [facility]  Shoulder extension    Shoulder abduction [facility]  Shoulder adduction    Shoulder extension    Shoulder internal rotation    Shoulder external rotation    Elbow flexion    Elbow extension    Wrist flexion    Wrist extension    Wrist ulnar deviation    Wrist radial deviation    Wrist pronation    Wrist supination     (Blank  rows = not tested)  UPPER EXTREMITY MMT:  MMT Right eval Left eval  Shoulder flexion 4-/5, ache pain  4-/5 recreates ache on R  Shoulder extension    Shoulder abduction 4-/5 ache pain  4-/5 recreates ache on R  Shoulder adduction    Shoulder extension    Shoulder internal rotation    Shoulder external rotation    Middle trapezius    Lower trapezius    Elbow flexion    Elbow extension    Wrist flexion    Wrist extension    Wrist ulnar deviation    Wrist radial deviation    Wrist pronation    Wrist supination    Grip strength     (Blank rows = not tested)  CERVICAL SPECIAL TESTS:  Distraction test: Negative , Does not improve or worsen symptoms.  FUNCTIONAL TESTS:    TREATMENT DATE:  08/21/2023  -Deep Neck Flexor Endurance Test: 22 seconds (+) -Spurling's test (-) -Supine CPA @ C6-7 painful and irritent with repeated motions.  -Middle trapezius: 3+/5 bilaterally -Lower trapezius: L: 3+/5  R: 3-/5 and painful in Right UT.  -External Rotation MMT: L 4-/5  R: 3+/5 and painful in UT.  -Grip Strength: 85lb bilaterally -Levator Scapulae stretch 2 x 30'' -Prone scapular squeezes with shoulder extension 3 x 10'' -Wall angels 2 x 30''   08/21/2023: PT Evaluation and HEP given  PATIENT EDUCATION:  Education details: PT evaluation, objective findings, POC, Importance of HEP, Precautions, Clinic policies  Person educated: Patient Education method: Explanation and Demonstration Education comprehension: verbalized understanding and returned demonstration  HOME EXERCISE PROGRAM: Access Code: [email or web address] URL: [email or web address] Date: 08/14/2023 Prepared by: Fabiola Backer Powell-Butler  Exercises - Supine Chin Tuck  - 2 x daily - 7 x weekly - 2 sets - 10 reps - 3 hold - Seated Upper Trapezius Stretch  - 2 x daily - 7 x weekly - 2 sets - 30  hold - Seated Assisted Cervical Rotation with Towel  - 1 x daily - 7 x weekly - 2 sets - 30 hold  ASSESSMENT:  CLINICAL IMPRESSION:   Pt tolerating therapy session well. Continued with more MMT testing and joint play. Pt with hypermobility at segmental CPA for distal cervical spine. Provided for more postural strengthening in middle and lower trapezius. Grip strength testing was equally bilaterally. Pt positive for reduced neck flexor endurance. Would promote further scapular/postural endurance strength and promoting proper alignment. Pt will benefit from skilled Physical Therapy services to address deficits/limitations in order to improve functional and QOL.    Patient is a 28 y.o. female who was seen today for physical therapy evaluation and treatment for M54.2 (ICD-10-CM) - Cervicalgia. Patient presents to PT with reports of moderate neck pain that occasionally radiates down the R shoulder/arm. R lateral flexion, R rotation, and flexion are most provoking motions and reproduce her pain along with shoulder MMT. Patient demonstrates tightness throughout neck musculature, R>L, and stiffness throughout cervical spine that may be contributing to her symptoms. Patient will benefit from skilled physical therapy in order to address the above to improve her pain and QOL.   OBJECTIVE IMPAIRMENTS: decreased mobility, decreased strength, impaired UE functional use, and pain.   ACTIVITY LIMITATIONS: carrying, lifting, dressing, and reach over head  PARTICIPATION LIMITATIONS: cleaning, driving, and occupation   REHAB POTENTIAL: Good  CLINICAL DECISION MAKING: Stable/uncomplicated  EVALUATION COMPLEXITY: Low   GOALS: Goals reviewed with patient? No  SHORT TERM GOALS: Target date: 08/28/23  Patient will be independent with performance of HEP to demonstrate adequate self management of symptoms.  Baseline:  Goal status: INITIAL  2.   Patient will report at least a 25% improvement overall since  beginning PT. Baseline:  Goal status: INITIAL  LONG TERM GOALS: Target date: 09/11/23  Patient will improve NDI score by 9 points to demonstrate improved perceived function. Baseline: See above Goal status: INITIAL 2.  Patient will perform full cervical flexion, R rotation and R lateral flexion with no radiating symptoms to show improved neck mobility for safe performance of functional tasks.  Baseline: See above Goal status: INITIAL 3.  Patient will score at least a  4+/5 on UE MMT with reports of decreased pain to show increased UE strength for over head activities. Baseline: See above Goal status: INITIAL   PLAN:  PT FREQUENCY: 1-2x/week  PT DURATION: 4 weeks  PLANNED INTERVENTIONS: 97164- PT Re-evaluation, 97110-Therapeutic exercises, 97530- Therapeutic activity, 97112- Neuromuscular re-education, 97535- Self Care, 16109- Manual therapy, Patient/Family education, Dry Needling, and Moist heat  PLAN FOR NEXT SESSION: Postural strengthening  Elie Goody, DPT [facility] [place] [phone] office   2:42 PM, 08/21/23

## 2023-08-29 ENCOUNTER — Ambulatory Visit (HOSPITAL_COMMUNITY): Attending: Nurse Practitioner

## 2023-08-29 DIAGNOSIS — M542 Cervicalgia: Secondary | ICD-10-CM | POA: Insufficient documentation

## 2023-08-29 DIAGNOSIS — Z7409 Other reduced mobility: Secondary | ICD-10-CM | POA: Insufficient documentation

## 2023-08-29 NOTE — Therapy (Signed)
 OUTPATIENT PHYSICAL THERAPY CERVICAL EVALUATION   Patient Name: Lynn Rice MRN: [id number] DOB:Jul 16, 1995, 28 y.o., female Today's Date: 08/29/2023  END OF SESSION:  PT End of Session - 08/29/23 1344     Visit Number 3    Number of Visits 5    Date for PT Re-Evaluation 09/11/23    Authorization Type [employer]    Authorization Time Period no auth    PT Start Time 1347    PT Stop Time 1430    PT Time Calculation (min) 43 min    Activity Tolerance Patient tolerated treatment well    Behavior During Therapy WFL for tasks assessed/performed              Past Medical History:  Diagnosis Date   No known health problems    Past Surgical History:  Procedure Laterality Date   TONSILLECTOMY     There are no active problems to display for this patient.   PCP: Babs Sciara, MD  REFERRING PROVIDER: Micael Hampshire, FNP  REFERRING DIAG: M54.2 (ICD-10-CM) - Cervicalgia  THERAPY DIAG:  Neck pain on right side  Impaired functional mobility and activity tolerance  Cervicalgia  Rationale for Evaluation and Treatment: Rehabilitation  ONSET DATE: January 2025  SUBJECTIVE:                                                                                                                                                                                                         SUBJECTIVE STATEMENT: Pt report she was very achy, 7.5/10, yesterday. Feels okay right now. Radiated down RUE  and came in waves. Felt like a shock down to the elbow. Hand felt kind of numb. Could feel pull and tightness with HEP but its better now.   Patient reports very severe neck pain that began insidiously in January. That radiates down R arm. Pain is an ache most times but shooting pain sometimes. Pain also sometimes feels like a heaviness and numbness. Tried sterioids pills and steroid shots that helped some. Every few days a pain that feels like a bee sting comes on that lasts a second  but goes away almost automatically. Tried a new pillow to sleep but it hasnt helped much. Sleeps on side mostly. Pain Wakes her 1-2 times a night. Pain occurs at mid cervical spine and Goes down into R shoulder to mid shoulder blade area. Patient reported recent increase in anxiety medication dosage. Medication is not listed in medication list.   Hand dominance: Right  PERTINENT HISTORY:  N/A  PAIN:  Are  you having pain? No  PRECAUTIONS: None  RED FLAGS: None     WEIGHT BEARING RESTRICTIONS: No  FALLS:  Has patient fallen in last 6 months? No   OCCUPATION: Pre school Development worker, community  PLOF: Independent.  In January she needed helped with getting clothes on and had to get shower chair due to pain. No longer has to use it.  PATIENT GOALS: Wants to stop hurting.  NEXT MD VISIT: 6 months from now, 09/22  OBJECTIVE:  Note: Objective measures were completed at Evaluation unless otherwise noted.  DIAGNOSTIC FINDINGS:  N/A  PATIENT SURVEYS:  NDI: 9 / 50 = 18.0 %  COGNITION: Overall cognitive status: Within functional limits for tasks assessed  SENSATION: Not tested , Next session  POSTURE: No Significant postural limitations  PALPATION: TTP and tightness felt in R upper trap region, palpation recreates patient's familiar pain symptoms  TTP and stiffness in  C7-T1 area. CPA in this region recreates patient's familiar pain symptoms   CERVICAL ROM:   Active ROM A/PROM (deg) eval  Flexion 100% w/ achy pain  Extension 100 %  Right lateral flexion 100% w/ achy pain  Left lateral flexion 100%  Right rotation 100% recreates pain most  Left rotation 100%   (Blank rows = not tested)  UPPER EXTREMITY ROM:  Active ROM Right eval Left eval  Shoulder flexion [facility]  Shoulder extension    Shoulder abduction [facility]  Shoulder adduction    Shoulder extension    Shoulder internal rotation    Shoulder external rotation    Elbow flexion    Elbow  extension    Wrist flexion    Wrist extension    Wrist ulnar deviation    Wrist radial deviation    Wrist pronation    Wrist supination     (Blank rows = not tested)  UPPER EXTREMITY MMT:  MMT Right eval Left eval  Shoulder flexion 4-/5, ache pain  4-/5 recreates ache on R  Shoulder extension    Shoulder abduction 4-/5 ache pain  4-/5 recreates ache on R  Shoulder adduction    Shoulder extension    Shoulder internal rotation    Shoulder external rotation    Middle trapezius    Lower trapezius    Elbow flexion    Elbow extension    Wrist flexion    Wrist extension    Wrist ulnar deviation    Wrist radial deviation    Wrist pronation    Wrist supination    Grip strength     (Blank rows = not tested)  CERVICAL SPECIAL TESTS:  Distraction test: Negative , Does not improve or worsen symptoms.  FUNCTIONAL TESTS:    TREATMENT DATE:  08/29/23: -Wall angels: 30'', 35", 45" -T's against wall: RTB, 2x10 -Shoulder Extension, RTB, 2x10 -ER/IR w/ RTB, 10 x each, painful -Punches, RTB, 2x10 -Upper Trap stretch, 30" bilat -Levator Scap stretch, 30" bilat -Doorway Rhomboids stretch, 30" -Doorway Pec Stretch, 30" bilat  08/21/2023  -Deep Neck Flexor Endurance Test: 22 seconds (+) -Spurling's test (-) -Supine CPA @ C6-7 painful and irritent with repeated motions.  -Middle trapezius: 3+/5 bilaterally -Lower trapezius: L: 3+/5  R: 3-/5 and painful in Right UT.  -External Rotation MMT: L 4-/5  R: 3+/5 and painful in UT.  -Grip Strength: 85lb bilaterally -Levator Scapulae stretch 2 x 30'' -Prone scapular squeezes with shoulder extension 3 x 10'' -Wall angels 2 x 30''   08/21/2023: PT Evaluation and HEP given  PATIENT EDUCATION:  Education details: PT evaluation, objective findings, POC, Importance of HEP, Precautions, Clinic policies  Person  educated: Patient Education method: Explanation and Demonstration Education comprehension: verbalized understanding and returned demonstration  HOME EXERCISE PROGRAM: Access Code: [email or web address] URL: [email or web address] Date: 08/14/2023 Prepared by: Fabiola Backer Powell-Butler  Exercises - Supine Chin Tuck  - 2 x daily - 7 x weekly - 2 sets - 10 reps - 3 hold - Seated Upper Trapezius Stretch  - 2 x daily - 7 x weekly - 2 sets - 30 hold - Seated Assisted Cervical Rotation with Towel  - 1 x daily - 7 x weekly - 2 sets - 30 hold  Exercises - Shoulder Extension with Resistance  - 1 x daily - 7 x weekly - 3 sets - 10 reps - Standing Shoulder Horizontal Abduction with Resistance  - 1 x daily - 7 x weekly - 3 sets - 10 reps - Standing Lean Away Doorway Stretch  - 2 x daily - 7 x weekly - 2 sets - 10 reps - 30 hold - Single Arm Doorway Pec Stretch at 60 Elevation  - 2 x daily - 7 x weekly - 2 sets - 10 reps - 30 hold  ASSESSMENT:  CLINICAL IMPRESSION: Patient tolerated session well. Reports overall improvement with function since beginning but intermittent increases in neck and upper trap pain bilaterally, with radiating pain at times. Session focused on postural strengthening against resistance. Patient with good carryover of HEP and verbal cues to limit shoulder elevation t/o new HEP. Patient reporting pain at end of range motion with Ts and ER on RUE. And decreased muscular endurance shoulder extension and T's. Tightness noted in pectoral area. Addressed with additional stretching. Pt will benefit from skilled Physical Therapy services to address deficits/limitations in order to improve functional and QOL.    Patient is a 28 y.o. female who was seen today for physical therapy evaluation and treatment for M54.2 (ICD-10-CM) - Cervicalgia. Patient presents to PT with reports of moderate neck pain that occasionally radiates down the R shoulder/arm. R lateral flexion, R rotation, and flexion are  most provoking motions and reproduce her pain along with shoulder MMT. Patient demonstrates tightness throughout neck musculature, R>L, and stiffness throughout cervical spine that may be contributing to her symptoms. Patient will benefit from skilled physical therapy in order to address the above to improve her pain and QOL.   OBJECTIVE IMPAIRMENTS: decreased mobility, decreased strength, impaired UE functional use, and pain.   ACTIVITY LIMITATIONS: carrying, lifting, dressing, and reach over head  PARTICIPATION LIMITATIONS: cleaning, driving, and occupation   REHAB POTENTIAL: Good  CLINICAL DECISION MAKING: Stable/uncomplicated  EVALUATION COMPLEXITY: Low   GOALS: Goals reviewed with patient? No  SHORT TERM GOALS: Target date: 08/28/23  Patient will be independent with performance of HEP to demonstrate adequate self management of symptoms.  Baseline:  Goal status: MET  2.   Patient will report at least a 25% improvement overall since beginning PT. Baseline: Reports 20% improvement in function Goal status: IN PROGRESS  LONG TERM GOALS: Target date: 09/11/23  Patient will improve NDI score by 9 points to demonstrate improved perceived function. Baseline: See above Goal status: INITIAL 2.  Patient will perform full cervical flexion, R rotation and R lateral flexion with no radiating symptoms to show improved neck mobility for safe performance of functional tasks.  Baseline: See above Goal status: INITIAL 3.  Patient will score at least a  4+/5 on UE MMT with reports of decreased  pain to show increased UE strength for over head activities. Baseline: See above Goal status: INITIAL   PLAN:  PT FREQUENCY: 1-2x/week  PT DURATION: 4 weeks  PLANNED INTERVENTIONS: 97164- PT Re-evaluation, 97110-Therapeutic exercises, 97530- Therapeutic activity, 97112- Neuromuscular re-education, 97535- Self Care, [id number]- Manual therapy, Patient/Family education, Dry Needling, and Moist  heat  PLAN FOR NEXT SESSION: Postural strengthening     2:55 PM, 08/29/23 Shirely Toren Powell-Butler, PT, DPT [facility] [place]

## 2023-09-03 ENCOUNTER — Ambulatory Visit (HOSPITAL_COMMUNITY): Admitting: Physical Therapy

## 2023-09-03 DIAGNOSIS — M542 Cervicalgia: Secondary | ICD-10-CM

## 2023-09-03 DIAGNOSIS — Z7409 Other reduced mobility: Secondary | ICD-10-CM

## 2023-09-03 NOTE — Therapy (Signed)
 OUTPATIENT PHYSICAL THERAPY TREATMENT  Patient Name: Lynn Rice MRN: [id number] DOB:[birth year], 28 y.o., female Today's Date: 09/03/2023  END OF SESSION:  PT End of Session - 09/03/23 1635     Visit Number 4    Number of Visits 5    Date for PT Re-Evaluation 09/11/23    Authorization Type [employer]    Authorization Time Period no auth    PT Start Time 1148    PT Stop Time 1228    PT Time Calculation (min) 40 min    Activity Tolerance Patient tolerated treatment well    Behavior During Therapy WFL for tasks assessed/performed               Past Medical History:  Diagnosis Date   No known health problems    Past Surgical History:  Procedure Laterality Date   TONSILLECTOMY     There are no active problems to display for this patient.   PCP: Babs Sciara, MD  REFERRING PROVIDER: Micael Hampshire, FNP  REFERRING DIAG: M54.2 (ICD-10-CM) - Cervicalgia  THERAPY DIAG:  Neck pain on right side  Impaired functional mobility and activity tolerance  Cervicalgia  Rationale for Evaluation and Treatment: Rehabilitation  ONSET DATE: January 2025  SUBJECTIVE:                                                                                                                                                                                                         SUBJECTIVE STATEMENT: Pt reports she had some pain over the weekend but less today.  Radiating pain at times, comes and goes and sometimes shooting/shocking pain.   Always on the Rt but sometimes in Lt.    Patient reports very severe neck pain that began insidiously in January. That radiates down R arm. Pain is an ache most times but shooting pain sometimes. Pain also sometimes feels like a heaviness and numbness. Tried sterioids pills and steroid shots that helped some. Every few days a pain that feels like a bee sting comes on that lasts a second but goes away almost automatically. Tried a new pillow  to sleep but it hasnt helped much. Sleeps on side mostly. Pain Wakes her 1-2 times a night. Pain occurs at mid cervical spine and Goes down into R shoulder to mid shoulder blade area. Patient reported recent increase in anxiety medication dosage. Medication is not listed in medication list.   Hand dominance: Right  PERTINENT HISTORY:  N/A  PAIN:  Are you having pain? No  PRECAUTIONS: None  RED FLAGS:  None     WEIGHT BEARING RESTRICTIONS: No  FALLS:  Has patient fallen in last 6 months? No   OCCUPATION: Pre school Development worker, community  PLOF: Independent.  In January she needed helped with getting clothes on and had to get shower chair due to pain. No longer has to use it.  PATIENT GOALS: Wants to stop hurting.  NEXT MD VISIT: 6 months from now, 09/22  OBJECTIVE:  Note: Objective measures were completed at Evaluation unless otherwise noted.  DIAGNOSTIC FINDINGS:  N/A  PATIENT SURVEYS:  NDI: 9 / 50 = 18.0 %  COGNITION: Overall cognitive status: Within functional limits for tasks assessed  SENSATION: Not tested , Next session  POSTURE: No Significant postural limitations  PALPATION: TTP and tightness felt in R upper trap region, palpation recreates patient's familiar pain symptoms  TTP and stiffness in  C7-T1 area. CPA in this region recreates patient's familiar pain symptoms   CERVICAL ROM:   Active ROM A/PROM (deg) eval  Flexion 100% w/ achy pain  Extension 100 %  Right lateral flexion 100% w/ achy pain  Left lateral flexion 100%  Right rotation 100% recreates pain most  Left rotation 100%   (Blank rows = not tested)  UPPER EXTREMITY ROM:  Active ROM Right eval Left eval  Shoulder flexion [facility]  Shoulder extension    Shoulder abduction [facility]  Shoulder adduction    Shoulder extension    Shoulder internal rotation    Shoulder external rotation    Elbow flexion    Elbow extension    Wrist flexion    Wrist extension    Wrist  ulnar deviation    Wrist radial deviation    Wrist pronation    Wrist supination     (Blank rows = not tested)  UPPER EXTREMITY MMT:  MMT Right eval Left eval  Shoulder flexion 4-/5, ache pain  4-/5 recreates ache on R  Shoulder extension    Shoulder abduction 4-/5 ache pain  4-/5 recreates ache on R  Shoulder adduction    Shoulder extension    Shoulder internal rotation    Shoulder external rotation    Middle trapezius    Lower trapezius    Elbow flexion    Elbow extension    Wrist flexion    Wrist extension    Wrist ulnar deviation    Wrist radial deviation    Wrist pronation    Wrist supination    Grip strength     (Blank rows = not tested)  CERVICAL SPECIAL TESTS:  Distraction test: Negative , Does not improve or worsen symptoms.  FUNCTIONAL TESTS:    TREATMENT DATE:  09/03/23 UBE 2' forward level 1 Standing:  doorway stretch for chest 2X30"  Wall push ups 10X Seated W backs with 5" holds Postural education Prone extension/POE Prone soft tissue, myofascial techniques, trigger pt release   08/29/23: -Wall angels: 30'', 35", 45" -T's against wall: RTB, 2x10 -Shoulder Extension, RTB, 2x10 -ER/IR w/ RTB, 10 x each, painful -Punches, RTB, 2x10 -Upper Trap stretch, 30" bilat -Levator Scap stretch, 30" bilat -Doorway Rhomboids stretch, 30" -Doorway Pec Stretch, 30" bilat  08/21/2023  -Deep Neck Flexor Endurance Test: 22 seconds (+) -Spurling's test (-) -Supine CPA @ C6-7 painful and irritent with repeated motions.  -Middle trapezius: 3+/5 bilaterally -Lower trapezius: L: 3+/5  R: 3-/5 and painful in Right UT.  -External Rotation MMT: L 4-/5  R: 3+/5 and painful in UT.  -Grip Strength: 85lb bilaterally -  Levator Scapulae stretch 2 x 30'' -Prone scapular squeezes with shoulder extension 3 x 10'' -Wall angels 2 x 30''   08/21/2023: PT Evaluation and HEP given                                                                                                                                PATIENT EDUCATION:  Education details: PT evaluation, objective findings, POC, Importance of HEP, Precautions, Clinic policies  Person educated: Patient Education method: Explanation and Demonstration Education comprehension: verbalized understanding and returned demonstration  HOME EXERCISE PROGRAM: Access Code: [email or web address] URL: [email or web address] Date: 08/14/2023 Prepared by: Fabiola Backer Powell-Butler  Exercises - Supine Chin Tuck  - 2 x daily - 7 x weekly - 2 sets - 10 reps - 3 hold - Seated Upper Trapezius Stretch  - 2 x daily - 7 x weekly - 2 sets - 30 hold - Seated Assisted Cervical Rotation with Towel  - 1 x daily - 7 x weekly - 2 sets - 30 hold  Exercises - Shoulder Extension with Resistance  - 1 x daily - 7 x weekly - 3 sets - 10 reps - Standing Shoulder Horizontal Abduction with Resistance  - 1 x daily - 7 x weekly - 3 sets - 10 reps - Standing Lean Away Doorway Stretch  - 2 x daily - 7 x weekly - 2 sets - 10 reps - 30 hold - Single Arm Doorway Pec Stretch at 60 Elevation  - 2 x daily - 7 x weekly - 2 sets - 10 reps - 30 hold  ASSESSMENT:  CLINICAL IMPRESSION: Session focused mainly on pain control this session using manual techniques.  Multiple spasms palpated Rt>Lt all long thoracic and into cervical/trap.  Pt sensitive at times but mostly tolerated well.  Was able to resolve most areas with combination of soft tissue and myofascial techniques.  Some trigger areas also palpated. Instructed to continue with postural strengthening given for HEP and educated on how important this is for resolving her dysfunction. Pt will benefit from skilled Physical Therapy services to address deficits/limitations in order to improve functional and QOL.    Patient is a 28 y.o. female who was seen today for physical therapy evaluation and treatment for M54.2 (ICD-10-CM) - Cervicalgia. Patient presents to PT with reports of moderate neck pain that  occasionally radiates down the R shoulder/arm. R lateral flexion, R rotation, and flexion are most provoking motions and reproduce her pain along with shoulder MMT. Patient demonstrates tightness throughout neck musculature, R>L, and stiffness throughout cervical spine that may be contributing to her symptoms. Patient will benefit from skilled physical therapy in order to address the above to improve her pain and QOL.   OBJECTIVE IMPAIRMENTS: decreased mobility, decreased strength, impaired UE functional use, and pain.   ACTIVITY LIMITATIONS: carrying, lifting, dressing, and reach over head  PARTICIPATION LIMITATIONS: cleaning, driving, and occupation   REHAB POTENTIAL: Good  CLINICAL DECISION MAKING: Stable/uncomplicated  EVALUATION COMPLEXITY: Low   GOALS: Goals reviewed with patient? No  SHORT TERM GOALS: Target date: 08/28/23  Patient will be independent with performance of HEP to demonstrate adequate self management of symptoms.  Baseline:  Goal status: MET  2.   Patient will report at least a 25% improvement overall since beginning PT. Baseline: Reports 20% improvement in function Goal status: IN PROGRESS  LONG TERM GOALS: Target date: 09/11/23  Patient will improve NDI score by 9 points to demonstrate improved perceived function. Baseline: See above Goal status: INITIAL 2.  Patient will perform full cervical flexion, R rotation and R lateral flexion with no radiating symptoms to show improved neck mobility for safe performance of functional tasks.  Baseline: See above Goal status: INITIAL 3.  Patient will score at least a  4+/5 on UE MMT with reports of decreased pain to show increased UE strength for over head activities. Baseline: See above Goal status: INITIAL   PLAN:  PT FREQUENCY: 1-2x/week  PT DURATION: 4 weeks  PLANNED INTERVENTIONS: 97164- PT Re-evaluation, 97110-Therapeutic exercises, 97530- Therapeutic activity, 97112- Neuromuscular re-education, 97535-  Self Care, 65784- Manual therapy, Patient/Family education, Dry Needling, and Moist heat  PLAN FOR NEXT SESSION: Reassess.  Follow up with manual techniques and if helped to reduce symptoms.      4:36 PM, 09/03/23 Lurena Nida, PTA/CLT [facility] Ph: [phone]

## 2023-09-04 ENCOUNTER — Encounter (HOSPITAL_COMMUNITY): Admitting: Physical Therapy

## 2023-09-09 ENCOUNTER — Ambulatory Visit (HOSPITAL_COMMUNITY)

## 2023-09-18 ENCOUNTER — Encounter (HOSPITAL_COMMUNITY): Payer: Self-pay

## 2023-09-18 ENCOUNTER — Ambulatory Visit (HOSPITAL_COMMUNITY)

## 2023-09-18 DIAGNOSIS — M542 Cervicalgia: Secondary | ICD-10-CM | POA: Diagnosis not present

## 2023-09-18 DIAGNOSIS — Z7409 Other reduced mobility: Secondary | ICD-10-CM | POA: Diagnosis not present

## 2023-09-18 NOTE — Therapy (Signed)
 OUTPATIENT PHYSICAL THERAPY TREATMENT PHYSICAL THERAPY DISCHARGE SUMMARY  Visits from Start of Care: 5  Current functional level related to goals / functional outcomes: See below   Remaining deficits: None   Education / Equipment: See below   Patient agrees to discharge. Patient goals were met. Patient is being discharged due to meeting the stated rehab goals.    Patient Name: Lynn Rice MRN: [id number] DOB:1995-11-10, 28 y.o., female Today's Date: 09/18/2023  END OF SESSION:  PT End of Session - 09/18/23 0828     Visit Number 5    Number of Visits 5    Date for PT Re-Evaluation 09/11/23    Authorization Type [employer]    Authorization Time Period no auth    PT Start Time 0808    PT Stop Time 0828    PT Time Calculation (min) 20 min    Activity Tolerance Patient tolerated treatment well;No increased pain    Behavior During Therapy WFL for tasks assessed/performed                Past Medical History:  Diagnosis Date   No known health problems    Past Surgical History:  Procedure Laterality Date   TONSILLECTOMY     There are no active problems to display for this patient.   PCP: Bennet Brasil, MD  REFERRING PROVIDER: Harriet Limber, FNP  REFERRING DIAG: M54.2 (ICD-10-CM) - Cervicalgia  THERAPY DIAG:  Neck pain on right side  Impaired functional mobility and activity tolerance  Cervicalgia  Rationale for Evaluation and Treatment: Rehabilitation  ONSET DATE: January 2025  SUBJECTIVE:                                                                                                                                                                                                         SUBJECTIVE STATEMENT: Pt states neck still hurts on occasion. Pt reports being about 80%, still bothers her some.  Pt reports she had some pain over the weekend but less today.  Radiating pain at times, comes and goes and sometimes  shooting/shocking pain.   Always on the Rt but sometimes in Lt.    Patient reports very severe neck pain that began insidiously in January. That radiates down R arm. Pain is an ache most times but shooting pain sometimes. Pain also sometimes feels like a heaviness and numbness. Tried sterioids pills and steroid shots that helped some. Every few days a pain that feels like a bee sting comes on that lasts a second but goes away almost automatically.  Tried a new pillow to sleep but it hasnt helped much. Sleeps on side mostly. Pain Wakes her 1-2 times a night. Pain occurs at mid cervical spine and Goes down into R shoulder to mid shoulder blade area. Patient reported recent increase in anxiety medication dosage. Medication is not listed in medication list.   Hand dominance: Right  PERTINENT HISTORY:  N/A  PAIN:  Are you having pain? No  PRECAUTIONS: None  RED FLAGS: None     WEIGHT BEARING RESTRICTIONS: No  FALLS:  Has patient fallen in last 6 months? No   OCCUPATION: Pre school Development worker, community  PLOF: Independent.  In January she needed helped with getting clothes on and had to get shower chair due to pain. No longer has to use it.  PATIENT GOALS: Wants to stop hurting.  NEXT MD VISIT: 6 months from now, 09/22  OBJECTIVE:  Note: Objective measures were completed at Evaluation unless otherwise noted.  DIAGNOSTIC FINDINGS:  N/A  PATIENT SURVEYS:  NDI: 9 / 50 = 18.0 %   NDI: 0/50  COGNITION: Overall cognitive status: Within functional limits for tasks assessed  SENSATION: Not tested , Next session  POSTURE: No Significant postural limitations  PALPATION: TTP and tightness felt in R upper trap region, palpation recreates patient's familiar pain symptoms  TTP and stiffness in  C7-T1 area. CPA in this region recreates patient's familiar pain symptoms   CERVICAL ROM:   Active ROM A/PROM (deg) eval 09/18/23  Flexion 100% w/ achy pain 100, no pain   Extension 100 % 100, no pain  Right lateral flexion 100% w/ achy pain 100, no pain  Left lateral flexion 100% 100, no pain  Right rotation 100% recreates pain most 100, no pain  Left rotation 100% 100, no pain   (Blank rows = not tested)  UPPER EXTREMITY ROM:  Active ROM Right eval Left eval  Shoulder flexion [facility]  Shoulder extension    Shoulder abduction [facility]  Shoulder adduction    Shoulder extension    Shoulder internal rotation    Shoulder external rotation    Elbow flexion    Elbow extension    Wrist flexion    Wrist extension    Wrist ulnar deviation    Wrist radial deviation    Wrist pronation    Wrist supination     (Blank rows = not tested)  UPPER EXTREMITY MMT:  MMT Right eval Left eval Right 09/18/23 Left 09/18/23  Shoulder flexion 4-/5, ache pain  4-/5 recreates ache on R 4+/5 4+/5  Shoulder extension      Shoulder abduction 4-/5 ache pain  4-/5 recreates ache on R 4+/5 4+/5  Shoulder adduction      Shoulder extension      Shoulder internal rotation      Shoulder external rotation      Middle trapezius      Lower trapezius      Elbow flexion      Elbow extension      Wrist flexion      Wrist extension      Wrist ulnar deviation      Wrist radial deviation      Wrist pronation      Wrist supination      Grip strength       (Blank rows = not tested)  CERVICAL SPECIAL TESTS:  Distraction test: Negative , Does not improve or worsen symptoms.  FUNCTIONAL TESTS:    TREATMENT DATE:  09/18/2023  Discharge, Goals reviewed, ROM assessed, UE strength assessed. Therapeutic Activity: Pt was educated on independent management of neck pain with HEP, reviewed chin tuck with alternative positions and to only do left cervical rotation snag.   09/03/23 UBE 2' forward level 1 Standing:  doorway stretch for chest 2X30"  Wall push ups 10X Seated W backs with 5" holds Postural education Prone extension/POE Prone soft tissue, myofascial techniques,  trigger pt release   08/29/23: -Wall angels: 30'', 35", 45" -T's against wall: RTB, 2x10 -Shoulder Extension, RTB, 2x10 -ER/IR w/ RTB, 10 x each, painful -Punches, RTB, 2x10 -Upper Trap stretch, 30" bilat -Levator Scap stretch, 30" bilat -Doorway Rhomboids stretch, 30" -Doorway Pec Stretch, 30" bilat  08/21/2023  -Deep Neck Flexor Endurance Test: 22 seconds (+) -Spurling's test (-) -Supine CPA @ C6-7 painful and irritent with repeated motions.  -Middle trapezius: 3+/5 bilaterally -Lower trapezius: L: 3+/5  R: 3-/5 and painful in Right UT.  -External Rotation MMT: L 4-/5  R: 3+/5 and painful in UT.  -Grip Strength: 85lb bilaterally -Levator Scapulae stretch 2 x 30'' -Prone scapular squeezes with shoulder extension 3 x 10'' -Wall angels 2 x 30''   08/21/2023: PT Evaluation and HEP given                                                                                                                               PATIENT EDUCATION:  Education details: PT evaluation, objective findings, POC, Importance of HEP, Precautions, Clinic policies  Person educated: Patient Education method: Explanation and Demonstration Education comprehension: verbalized understanding and returned demonstration  HOME EXERCISE PROGRAM: Access Code: [email or web address] URL: [email or web address] Date: 08/14/2023 Prepared by: Virgia Griffins Powell-Butler  Exercises - Supine Chin Tuck  - 2 x daily - 7 x weekly - 2 sets - 10 reps - 3 hold - Seated Upper Trapezius Stretch  - 2 x daily - 7 x weekly - 2 sets - 30 hold - Seated Assisted Cervical Rotation with Towel  - 1 x daily - 7 x weekly - 2 sets - 30 hold  Exercises - Shoulder Extension with Resistance  - 1 x daily - 7 x weekly - 3 sets - 10 reps - Standing Shoulder Horizontal Abduction with Resistance  - 1 x daily - 7 x weekly - 3 sets - 10 reps - Standing Lean Away Doorway Stretch  - 2 x daily - 7 x weekly - 2 sets - 10 reps - 30 hold - Single Arm Doorway  Pec Stretch at 60 Elevation  - 2 x daily - 7 x weekly - 2 sets - 10 reps - 30 hold  ASSESSMENT:  CLINICAL IMPRESSION: Re-evaluation with conclusion of pt discharge to HEP. Pt demonstrates meeting all of initial therapy goals at this time with increased pain-free ROM of cervical spine, increased UE strength, and improved score on NDI. Pt would continue to benefit from HEP at this time for independent management of neck symptoms should  they return.   Patient is a 28 y.o. female who was seen today for physical therapy evaluation and treatment for M54.2 (ICD-10-CM) - Cervicalgia. Patient presents to PT with reports of moderate neck pain that occasionally radiates down the R shoulder/arm. R lateral flexion, R rotation, and flexion are most provoking motions and reproduce her pain along with shoulder MMT. Patient demonstrates tightness throughout neck musculature, R>L, and stiffness throughout cervical spine that may be contributing to her symptoms. Patient will benefit from skilled physical therapy in order to address the above to improve her pain and QOL.   OBJECTIVE IMPAIRMENTS: decreased mobility, decreased strength, impaired UE functional use, and pain.   ACTIVITY LIMITATIONS: carrying, lifting, dressing, and reach over head  PARTICIPATION LIMITATIONS: cleaning, driving, and occupation   REHAB POTENTIAL: Good  CLINICAL DECISION MAKING: Stable/uncomplicated  EVALUATION COMPLEXITY: Low   GOALS: Goals reviewed with patient? No  SHORT TERM GOALS: Target date: 08/28/23  Patient will be independent with performance of HEP to demonstrate adequate self management of symptoms.  Baseline:  Goal status: MET  2.   Patient will report at least a 25% improvement overall since beginning PT. Baseline: Reports 20% improvement in function Goal status: MET  LONG TERM GOALS: Target date: 09/11/23  Patient will improve NDI score by 9 points to demonstrate improved perceived function. Baseline: See  above Goal status: MET 2.  Patient will perform full cervical flexion, R rotation and R lateral flexion with no radiating symptoms to show improved neck mobility for safe performance of functional tasks.  Baseline: See above Goal status: MET 3.  Patient will score at least a  4+/5 on UE MMT with reports of decreased pain to show increased UE strength for over head activities. Baseline: See above Goal status: MET   PLAN:  PT FREQUENCY: 1-2x/week  PT DURATION: 4 weeks  PLANNED INTERVENTIONS: 97164- PT Re-evaluation, 97110-Therapeutic exercises, 97530- Therapeutic activity, 97112- Neuromuscular re-education, 97535- Self Care, 96295- Manual therapy, Patient/Family education, Dry Needling, and Moist heat  PLAN FOR NEXT SESSION: Discharged this date     Armond Bertin, PT, DPT [facility] Office: [phone] 8:29 AM, 09/18/23
